# Patient Record
Sex: Male | Born: 1990 | Race: Black or African American | Hispanic: No | Marital: Single | Smoking: Current every day smoker
Health system: Southern US, Community
[De-identification: ages and names within clinical notes are randomized; demographics above are authoritative.]

## PROBLEM LIST (undated history)

## (undated) DIAGNOSIS — Z22322 Carrier or suspected carrier of Methicillin resistant Staphylococcus aureus: Secondary | ICD-10-CM

## (undated) DIAGNOSIS — G43909 Migraine, unspecified, not intractable, without status migrainosus: Secondary | ICD-10-CM

## (undated) DIAGNOSIS — F1410 Cocaine abuse, uncomplicated: Secondary | ICD-10-CM

## (undated) DIAGNOSIS — Z5900 Homelessness unspecified: Secondary | ICD-10-CM

## (undated) DIAGNOSIS — Z8614 Personal history of Methicillin resistant Staphylococcus aureus infection: Secondary | ICD-10-CM

## (undated) DIAGNOSIS — J3489 Other specified disorders of nose and nasal sinuses: Secondary | ICD-10-CM

## (undated) DIAGNOSIS — S60351A Superficial foreign body of right thumb, initial encounter: Secondary | ICD-10-CM

## (undated) HISTORY — PX: SHOULDER ARTHROSCOPY W/ LABRAL REPAIR: SHX2399

## (undated) HISTORY — PX: ORIF ANKLE FRACTURE: SHX5408

---
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
## 2010-05-30 ENCOUNTER — Emergency Department (HOSPITAL_COMMUNITY): Admission: EM | Admit: 2010-05-30 | Discharge: 2010-05-30 | Payer: Self-pay | Admitting: Emergency Medicine

## 2011-05-09 NOTE — Op Note (Signed)
[facility]  Patient:    Donald Hurst, Donald Hurst                        MRN: [id number] Proc. Date: 05/27/01 Adm. Date:  [id number] Attending:  Barbee Cough                           Operative Report  PREOPERATIVE DIAGNOSES AND INDICATIONS FOR SURGERY: 1. Recurrent acute tonsillitis. 2. Adenotonsillar hypertrophy. 3. Recurrent epistaxis.  POSTOPERATIVE DIAGNOSES: 1. Recurrent acute tonsillitis. 2. Adenotonsillar hypertrophy. 3. Recurrent epistaxis.  OPERATION: 1. Adenotonsillectomy. 2. Anterior nasal cautery.  SURGEON:  Kinnie Scales. Annalee Genta, M.D.  ANESTHESIA:  General endotracheal anesthesia.  COMPLICATIONS:  None.  ESTIMATED BLOOD LOSS:  Less than 50 cc.  DISPOSITION:  The patient transferred from the operating room to the recovery room in stable condition.  He will follow up in my office in approximately two weeks for postoperative care.  BRIEF HISTORY:  Donald Hurst is a 20 year old black male who presented in referral from his primary care physician for evaluation of recurrent epistaxis and recurrent tonsillitis.  The patient had been treated with numerous courses of antibiotics in the last year and had a history of recurrent streptococcal infection, adenotonsillar enlargement, and nighttime snoring.  The patient also had a history of intermittent epistaxis.  He had been treated with epistaxis precautions including frequent nasal saline spray and topical ointment.  Despite this, he has continued to have intermittent brief episodes of bleeding from the anterior nasal passage way.  Given the patients history, examination, and findings, the risks, benefits, and possible complications of the above surgical procedures were discussed in detail with the patient and his mother.  They understood and concurred with our plan for surgery which was scheduled as above.  DESCRIPTION OF PROCEDURE:  The patient was brought to the operating room at [facility] on May 27, 2001, and placed in the supine position on the operating table.  General endotracheal anesthesia was established without difficulty.  When the patient had been adequately anesthetized, his nasal cavity was examined, and he was found to have significant anterior submucosal blood vessels without evidence of active bleeding, mass, tumor, or ulceration.  The patients nose was paced with Afrin-soaked cottonoid pledgets which were left in place for approximately 5 minutes to all for vasoconstriction.  Using a Bovie suction cautery, areas of point hemorrhage and hypervascularity were cauterized bilaterally.  Care was taken not to cauterize corresponding spots on each side of the nasal septum.  At the conclusion of the cautery procedure, there was no active bleeding, and bacitracin ointment was placed over the cautery sites.  Attention was then turned to the patients oral cavity and oropharynx.  A Crowe-Davis mouth gag was inserted without difficulty with no evidence of broken teeth, and hard and soft palate were intact.  The patient was found to have 2+ cryptic tonsils and adenoidal hypertrophy.  The adenoids were removed using the adenotome and several passes with the adenoid curet.  Posterior and nasopharynx were then packed with saline-soaked cottonoid gauze.  The patients tonsils were removed beginning on the left-hand side, dissecting in subcapsular fashion, removing the entire left tonsil from superior pole to tongue base.  The right tonsil was removed in a similar fashion, and all tissue that was resected was sent to pathology for gross and microscopic evaluation.  tonsillar fossae were gently abraded with  a dry tonsil sponge, and the Crowe-Davis mouth gag was released and reapplied.  There were several areas of point hemorrhage which were cauterized using suction cautery. There was no active bleeding.  Posterior nasopharyngeal packing was removed, and  the posterior nasopharynx was cauterized with suction cautery.  An orogastric tube was passed, and stomach contents were aspirated.  The patients oral cavity, oropharynx, nasal cavity, and nasopharynx were then irrigated with saline solution and suctioned.  The patient was then awakened from the anesthetic, extubated, and transferred from the operating room to the recovery room in stable condition.  There were no complications, and blood loss was less than 50 cc. DD:  05/27/01 TD:  05/27/01 Job: [id number]

## 2011-06-28 ENCOUNTER — Emergency Department (HOSPITAL_COMMUNITY)
Admission: EM | Admit: 2011-06-28 | Discharge: 2011-06-28 | Disposition: A | Discharge status: Home / Self Care | Payer: Self-pay | Attending: Emergency Medicine | Admitting: Emergency Medicine

## 2011-06-28 ENCOUNTER — Emergency Department (HOSPITAL_COMMUNITY): Payer: Self-pay

## 2011-06-28 DIAGNOSIS — IMO0002 Reserved for concepts with insufficient information to code with codable children: Secondary | ICD-10-CM | POA: Insufficient documentation

## 2013-12-29 ENCOUNTER — Encounter: Payer: Self-pay | Admitting: Family Medicine

## 2013-12-29 ENCOUNTER — Ambulatory Visit: Payer: Self-pay | Attending: Internal Medicine | Admitting: Family Medicine

## 2013-12-29 VITALS — BP 135/88 | HR 86 | Temp 98.4°F | Resp 17 | Ht 75.0 in | Wt 254.0 lb

## 2013-12-29 DIAGNOSIS — L859 Epidermal thickening, unspecified: Secondary | ICD-10-CM

## 2013-12-29 DIAGNOSIS — R6889 Other general symptoms and signs: Secondary | ICD-10-CM

## 2013-12-29 DIAGNOSIS — L989 Disorder of the skin and subcutaneous tissue, unspecified: Secondary | ICD-10-CM

## 2013-12-29 DIAGNOSIS — L851 Acquired keratosis [keratoderma] palmaris et plantaris: Secondary | ICD-10-CM

## 2013-12-29 DIAGNOSIS — R7989 Other specified abnormal findings of blood chemistry: Secondary | ICD-10-CM | POA: Insufficient documentation

## 2013-12-29 DIAGNOSIS — R899 Unspecified abnormal finding in specimens from other organs, systems and tissues: Secondary | ICD-10-CM

## 2013-12-29 NOTE — Progress Notes (Signed)
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

## 2013-12-29 NOTE — Progress Notes (Signed)
   Subjective:    Patient ID: Donald Hurst, male    DOB: 1991-04-03, 23 y.o.   MRN: [id number]  HPI This patient is seen today to establish care. 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 He goes to school at [employer] [place]. 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 He isn't positive that that's what the person says that she told him to be seen urgently. He denies any systemic symptoms including any symptoms of sexually transmitted diseases. He does not have any of the lab work with him.  He also is concerned about rough skin over both but opts which is itchy especially right after he gets out of the shower. He also has a single perianal bump and a similar bump on his left anterior scrotum. These have all been present since last summer and have not changed.   Review of Systems A 12 point review of systems is negative except as per hpi.      Objective:   Physical Exam  Nursing note and vitals reviewed. Constitutional: He is oriented to person, place, and time. He  appears well-developed and well-nourished.  HENT:  Right Ear: External ear normal.  L Cardiovascular: Normal rate, regular rhythm and normal heart sounds.   Pulmonary/Chest: Effort normal and breath sounds normal.  Abdominal: Soft. Bowel sounds are normal.  no distension. There is no tenderness. There is no rebound.  Lymphadenopathy:    He has no cervical adenopathy.  Neurological: He is alert and oriented to person, place, and time. He has normal reflexes.  Skin: Skin is warm and dry.the ducts are dry  And consistent with hyperkeratosis pilaris. The bumps mentioned in the history of present illness are both about a third of a centimeter or in diameter and raised. More consistent with skin tags then warts but not completely typical skin tags  Psychiatric: He has a normal mood and  affect. His behavior is normal.       Assessment & Plan:  Abnormal laboratory test result Will obtain the results from the health Department and proceed accordingly  Hyperkeratosis Per avs  Skin lesion - If the biopsy is probably a good idea for these lesions. The patient will make an appointment to come back and have them biopsied. See AVS for specific instructions.

## 2013-12-29 NOTE — Patient Instructions (Signed)
-   Skin irritation - moisturize well at least twice daily. Try an exfoliant.  - Call and schedule a Thursday for your skin biopsy. Make sure they put you on my schedule and remind them to let me know so I can have the needed supplies.  - We will obtain the records from the Health Department regarding your Hepatitis B. Once I see the results, I will call and let you know what the next steps are. For now, either avoid sexual contact or be sure to wear a condom.  -Call with any questions or concerns.

## 2014-11-23 ENCOUNTER — Emergency Department (HOSPITAL_COMMUNITY)
Admission: EM | Admit: 2014-11-23 | Discharge: 2014-11-23 | Disposition: A | Discharge status: Home / Self Care | Payer: No Typology Code available for payment source | Attending: Emergency Medicine | Admitting: Emergency Medicine

## 2014-11-23 ENCOUNTER — Encounter (HOSPITAL_COMMUNITY): Payer: Self-pay | Admitting: Emergency Medicine

## 2014-11-23 ENCOUNTER — Emergency Department (HOSPITAL_COMMUNITY): Payer: No Typology Code available for payment source

## 2014-11-23 DIAGNOSIS — S43401A Unspecified sprain of right shoulder joint, initial encounter: Secondary | ICD-10-CM | POA: Insufficient documentation

## 2014-11-23 DIAGNOSIS — Z720 Tobacco use: Secondary | ICD-10-CM | POA: Diagnosis not present

## 2014-11-23 DIAGNOSIS — S0990XA Unspecified injury of head, initial encounter: Secondary | ICD-10-CM | POA: Diagnosis not present

## 2014-11-23 DIAGNOSIS — Y998 Other external cause status: Secondary | ICD-10-CM | POA: Diagnosis not present

## 2014-11-23 DIAGNOSIS — Y9389 Activity, other specified: Secondary | ICD-10-CM | POA: Diagnosis not present

## 2014-11-23 DIAGNOSIS — S39012A Strain of muscle, fascia and tendon of lower back, initial encounter: Secondary | ICD-10-CM | POA: Diagnosis not present

## 2014-11-23 DIAGNOSIS — S8011XA Contusion of right lower leg, initial encounter: Secondary | ICD-10-CM | POA: Diagnosis not present

## 2014-11-23 DIAGNOSIS — Y92410 Unspecified street and highway as the place of occurrence of the external cause: Secondary | ICD-10-CM | POA: Insufficient documentation

## 2014-11-23 DIAGNOSIS — S3992XA Unspecified injury of lower back, initial encounter: Secondary | ICD-10-CM | POA: Diagnosis present

## 2014-11-23 DIAGNOSIS — Z9889 Other specified postprocedural states: Secondary | ICD-10-CM | POA: Insufficient documentation

## 2014-11-23 DIAGNOSIS — S20219A Contusion of unspecified front wall of thorax, initial encounter: Secondary | ICD-10-CM

## 2014-11-23 MED ORDER — IBUPROFEN 600 MG PO TABS: 600.0000 mg | ORAL_TABLET | Freq: Four times a day (QID) | ORAL | Status: DC | PRN

## 2014-11-23 MED ORDER — HYDROCODONE-ACETAMINOPHEN 5-325 MG PO TABS: 1.0000 | ORAL_TABLET | Freq: Four times a day (QID) | ORAL | Status: DC | PRN

## 2014-11-23 MED ORDER — TRAMADOL HCL 50 MG PO TABS: 50.0000 mg | ORAL_TABLET | Freq: Four times a day (QID) | ORAL | Status: DC | PRN

## 2014-11-23 MED ORDER — CYCLOBENZAPRINE HCL 10 MG PO TABS: 10.0000 mg | ORAL_TABLET | Freq: Two times a day (BID) | ORAL | Status: DC | PRN
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
## 2015-11-14 ENCOUNTER — Emergency Department (HOSPITAL_COMMUNITY)
Admission: EM | Admit: 2015-11-14 | Discharge: 2015-11-14 | Disposition: A | Discharge status: Home / Self Care | Payer: Self-pay | Attending: Emergency Medicine | Admitting: Emergency Medicine

## 2015-11-14 ENCOUNTER — Encounter (HOSPITAL_COMMUNITY): Payer: Self-pay | Admitting: Emergency Medicine

## 2015-11-14 DIAGNOSIS — Z8781 Personal history of (healed) traumatic fracture: Secondary | ICD-10-CM | POA: Insufficient documentation

## 2015-11-14 DIAGNOSIS — G8911 Acute pain due to trauma: Secondary | ICD-10-CM | POA: Insufficient documentation

## 2015-11-14 DIAGNOSIS — M25571 Pain in right ankle and joints of right foot: Secondary | ICD-10-CM | POA: Insufficient documentation

## 2015-11-14 DIAGNOSIS — Z7982 Long term (current) use of aspirin: Secondary | ICD-10-CM | POA: Insufficient documentation

## 2015-11-14 DIAGNOSIS — Z87891 Personal history of nicotine dependence: Secondary | ICD-10-CM | POA: Insufficient documentation

## 2015-11-14 MED ORDER — HYDROMORPHONE HCL 2 MG PO TABS: 1.0000 mg | ORAL_TABLET | ORAL | Status: DC | PRN

## 2015-11-14 MED ORDER — POLYETHYLENE GLYCOL 3350 17 G PO PACK: 17.0000 g | PACK | Freq: Every day | ORAL | Status: DC | PRN

## 2015-11-14 MED ORDER — KETOROLAC TROMETHAMINE 30 MG/ML IJ SOLN: 60.0000 mg | Freq: Once | INTRAMUSCULAR | Status: DC

## 2015-11-14 MED ORDER — MORPHINE SULFATE (PF) 4 MG/ML IV SOLN: 4.0000 mg | Freq: Once | INTRAVENOUS | Status: DC

## 2015-11-14 MED ORDER — HYDROMORPHONE HCL 1 MG/ML IJ SOLN
1.0000 mg | Freq: Once | INTRAMUSCULAR | Status: AC
  Administered 2015-11-14: 1 mg via INTRAVENOUS
  Filled 2015-11-14: qty 1

## 2015-11-14 MED ORDER — KETOROLAC TROMETHAMINE 30 MG/ML IJ SOLN
60.0000 mg | Freq: Once | INTRAMUSCULAR | Status: DC
  Filled 2015-11-14: qty 2

## 2015-11-14 MED ORDER — KETOROLAC TROMETHAMINE 30 MG/ML IJ SOLN
30.0000 mg | Freq: Once | INTRAMUSCULAR | Status: AC
  Administered 2015-11-14: 30 mg via INTRAVENOUS
  Filled 2015-11-14: qty 1

## 2015-11-14 MED ORDER — MORPHINE SULFATE (PF) 4 MG/ML IV SOLN
4.0000 mg | Freq: Once | INTRAVENOUS | Status: DC
Start: 2015-11-14 — End: 2015-11-14
  Filled 2015-11-14: qty 1

## 2015-11-14 NOTE — ED Provider Notes (Signed)
CSN: [id number]     Arrival date & time 11/14/15  2020 History   First MD Initiated Contact with Patient 11/14/15 2032     Chief Complaint  Patient presents with  . Post-op Problem     (Consider location/radiation/quality/duration/timing/severity/associated sxs/prior Treatment) HPI Comments: Consent for right ankle pain after surgery or fracture. He reports right ankle fracture Saturday due to football while in [place]. He had surgery there and was discharged to follow-up with his orthopedist here in [place]. He has been taking morphine ER 15 mg twice a day as well as some Norco 7.5 mg every 4 hours, but continues to endorse moderate pain. Denies any fevers, chills, drainage from his incision. Has appointment to follow-up with his local orthopedist on Tuesday.    History reviewed. No pertinent past medical history. Past Surgical History  Procedure Laterality Date  . Shoulder surgery Right   . Ankle surgery     No family history on file. Social History  Substance Use Topics  . Smoking status: Former Games developer  . Smokeless tobacco: None     Comment: marijuana  . Alcohol Use: Yes     Comment: occasionally    Review of Systems  All other systems reviewed and are negative.     Allergies  Shrimp and Tramadol  Home Medications   Prior to Admission medications   Medication Sig Start Date End Date Taking? Authorizing Provider  aspirin 325 MG EC tablet Take 325 mg by mouth 2 (two) times daily. Started medication on 11-13-15. Take for six weeks   Yes Historical Provider, MD  HYDROcodone-acetaminophen (NORCO) 7.5-325 MG tablet Take 2 tablets by mouth every 4 (four) hours as needed for moderate pain.   Yes Historical Provider, MD  oxycodone (OXY-IR) 5 MG capsule Take 10 mg by mouth every 6 (six) hours as needed for pain.   Yes Historical Provider, MD  PRESCRIPTION MEDICATION Take 1 tablet by mouth every 12 (twelve) hours.   Yes Historical Provider, MD  cyclobenzaprine (FLEXERIL) 10  MG tablet Take 1 tablet (10 mg total) by mouth 2 (two) times daily as needed for muscle spasms. Patient not taking: Reported on 11/14/2015 11/23/14   Jaynie Crumble, PA-C  HYDROcodone-acetaminophen (NORCO) 5-325 MG per tablet Take 1 tablet by mouth every 6 (six) hours as needed for moderate pain. Patient not taking: Reported on 11/14/2015 11/23/14   Tatyana Kirichenko, PA-C  HYDROmorphone (DILAUDID) 2 MG tablet Take 0.5-1 tablets (1-2 mg total) by mouth every 4 (four) hours as needed for severe pain. 11/14/15   Jamal Collin, MD  ibuprofen (ADVIL,MOTRIN) 600 MG tablet Take 1 tablet (600 mg total) by mouth every 6 (six) hours as needed. Patient not taking: Reported on 11/14/2015 11/23/14   Tatyana Kirichenko, PA-C  polyethylene glycol (MIRALAX / GLYCOLAX) packet Take 17 g by mouth daily as needed. 11/14/15   Jamal Collin, MD  traMADol (ULTRAM) 50 MG tablet Take 1 tablet (50 mg total) by mouth every 6 (six) hours as needed. Patient not taking: Reported on 11/14/2015 11/23/14   Tatyana Kirichenko, PA-C   BP 140/73 mmHg  Pulse 79  Temp(Src) 98.4 F (36.9 C) (Oral)  Resp 16  SpO2 99% Physical Exam  Constitutional: He is oriented to person, place, and time. He appears well-developed and well-nourished. No distress.  Eyes: EOM are normal. Pupils are equal, round, and reactive to light.  Cardiovascular: Normal rate and regular rhythm.   No murmur heard. Pulmonary/Chest: Effort normal and breath sounds normal. No respiratory  distress.  Abdominal: Soft. Bowel sounds are normal. There is no tenderness.  Musculoskeletal:  Right ankle mildly swollen. Staples clean without any drainage. Dorsalis pedis 2+.   Neurological: He is alert and oriented to person, place, and time.  Skin: Skin is warm. No rash noted. No erythema.  Nursing note and vitals reviewed.   ED Course  Procedures (including critical care time) Labs Review Labs Reviewed - No data to display  Imaging Review No results  found. I have personally reviewed and evaluated these images and lab results as part of my medical decision-making.   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 Pain control was achieved with IV Dilaudid and Toradol. He was discharged with by mouth Dilaudid for pain relief and advised to keep his scheduled orthopedic follow-up. No signs of infection or compartment syndrome.   Jamal CollinJames R Lili Harts, MD 11/14/2015, 10:08 PM PGY-3, [facility]     Jamal CollinJames R Davette Nugent, MD 11/14/15 [id number]  Nelva Nayobert Beaton, MD 11/14/15 2210

## 2015-11-14 NOTE — ED Notes (Signed)
Pt had surgery Sunday for broken ankle. Pt had cast removed and boot placed yesterday. Pt reports incision has been bleeding since and that he needs stronger pain medications

## 2015-11-14 NOTE — Discharge Instructions (Signed)
Your incision looks clean without infection. I recommend he keep your scheduled follow-up with orthopedist on Tuesday. Return to ED if you develop any fevers, chills.

## 2015-11-14 NOTE — ED Notes (Signed)
MD at bedside. 

## 2015-12-26 ENCOUNTER — Emergency Department (HOSPITAL_COMMUNITY)
Admission: EM | Admit: 2015-12-26 | Discharge: 2015-12-26 | Disposition: A | Discharge status: Home / Self Care | Payer: Self-pay | Attending: Emergency Medicine | Admitting: Emergency Medicine

## 2015-12-26 ENCOUNTER — Encounter (HOSPITAL_COMMUNITY): Payer: Self-pay | Admitting: *Deleted

## 2015-12-26 DIAGNOSIS — Z96698 Presence of other orthopedic joint implants: Secondary | ICD-10-CM | POA: Insufficient documentation

## 2015-12-26 DIAGNOSIS — Z7982 Long term (current) use of aspirin: Secondary | ICD-10-CM | POA: Insufficient documentation

## 2015-12-26 DIAGNOSIS — Z87891 Personal history of nicotine dependence: Secondary | ICD-10-CM | POA: Insufficient documentation

## 2015-12-26 DIAGNOSIS — Z760 Encounter for issue of repeat prescription: Secondary | ICD-10-CM | POA: Insufficient documentation

## 2015-12-26 DIAGNOSIS — M7989 Other specified soft tissue disorders: Secondary | ICD-10-CM | POA: Insufficient documentation

## 2015-12-26 MED ORDER — NAPROXEN 500 MG PO TABS: 500.0000 mg | ORAL_TABLET | Freq: Two times a day (BID) | ORAL | Status: DC

## 2015-12-26 MED ORDER — OXYCODONE HCL 5 MG PO TABS: 2.5000 mg | ORAL_TABLET | ORAL | Status: DC | PRN

## 2015-12-26 NOTE — ED Provider Notes (Signed)
CSN: [id number]     Arrival date & time 12/26/15  1926 History  By signing my name below, I, Donald Hurst, attest that this documentation has been prepared under the direction and in the presence of Arthor CaptainAbigail Zia Kanner, PA-C. Electronically Signed: Placido SouLogan Hurst, ED Scribe. 12/26/2015. 8:28 PM.   Chief Complaint  Patient presents with  . Medication Refill   The history is provided by the patient. No language interpreter was used.    HPI Comments: Donald Hurst is a 25 y.o. male who presents to the Emergency Department for a refill of his rx pain medication that he received s/p right ankle surgery. Pt notes a recent [place] to [place] with a cast currently in place. He notes some current itching of the affected toe and mild swelling of his toes which he says is alleviated when he elevates the foot. He notes he was taking percocet which provided adequate relief of his pain. He notes having a [facility] up [facility] on 1/12. He denies any other associated symptoms at this time.   History reviewed. No pertinent past medical history. Past Surgical History  Procedure Laterality Date  . Shoulder surgery Right   . Ankle surgery     History reviewed. No pertinent family history. Social History  Substance Use Topics  . Smoking status: Former Games developermoker  . Smokeless tobacco: None     Comment: marijuana  . Alcohol Use: Yes     Comment: occasionally    Review of Systems  Constitutional: Negative for fever and chills.  Musculoskeletal: Positive for joint swelling. Negative for myalgias and arthralgias.    Allergies  Shrimp and Tramadol  Home Medications   Prior to Admission medications   Medication Sig Start Date End Date Taking? Authorizing Provider  aspirin 325 MG EC tablet Take 325 mg by mouth 2 (two) times daily. Started medication on 11-13-15. Take for six weeks    Historical Provider, MD  cyclobenzaprine (FLEXERIL) 10 MG tablet Take 1 tablet (10 mg  total) by mouth 2 (two) times daily as needed for muscle spasms. Patient not taking: Reported on 11/14/2015 11/23/14   Jaynie Crumbleatyana Kirichenko, PA-C  HYDROcodone-acetaminophen (NORCO) 5-325 MG per tablet Take 1 tablet by mouth every 6 (six) hours as needed for moderate pain. Patient not taking: Reported on 11/14/2015 11/23/14   Jaynie Crumbleatyana Kirichenko, PA-C  HYDROcodone-acetaminophen (NORCO) 7.5-325 MG tablet Take 2 tablets by mouth every 4 (four) hours as needed for moderate pain.    Historical Provider, MD  HYDROmorphone (DILAUDID) 2 MG tablet Take 0.5-1 tablets (1-2 mg total) by mouth every 4 (four) hours as needed for severe pain. 11/14/15   Jamal CollinJames R Joyner, MD  ibuprofen (ADVIL,MOTRIN) 600 MG tablet Take 1 tablet (600 mg total) by mouth every 6 (six) hours as needed. Patient not taking: Reported on 11/14/2015 11/23/14   Tatyana Kirichenko, PA-C  naproxen (NAPROSYN) 500 MG tablet Take 1 tablet (500 mg total) by mouth 2 (two) times daily with a meal. 12/26/15   Arthor CaptainAbigail Aison Malveaux, PA-C  oxyCODONE (OXY IR/ROXICODONE) 5 MG immediate release tablet Take 0.5-1 tablets (2.5-5 mg total) by mouth every 4 (four) hours as needed for severe pain. 12/26/15   Arthor CaptainAbigail Ellar Hakala, PA-C  polyethylene glycol (MIRALAX / GLYCOLAX) packet Take 17 g by mouth daily as needed. 11/14/15   Jamal CollinJames R Joyner, MD  PRESCRIPTION MEDICATION Take 1 tablet by mouth every 12 (twelve) hours.    Historical Provider, MD  traMADol (ULTRAM) 50 MG  tablet Take 1 tablet (50 mg total) by mouth every 6 (six) hours as needed. Patient not taking: Reported on 11/14/2015 11/23/14   Tatyana Kirichenko, PA-C   BP 143/83 mmHg  Pulse 70  Temp(Src) 98.2 F (36.8 C) (Oral)  Resp 16  Ht 6\' 3"  (1.905 m)  Wt 113.399 kg  BMI 31.25 kg/m2  SpO2 98%    Physical Exam  Constitutional: He is oriented to person, place, and time. He appears well-developed and well-nourished.  HENT:  Head: Normocephalic and atraumatic.  Neck: Normal range of motion.  Cardiovascular: Normal  rate.   Pulmonary/Chest: Effort normal. No respiratory distress.  Abdominal: Soft.  Musculoskeletal: Normal range of motion.  Pt has a cast in place on his right foot  Neurological: He is alert and oriented to person, place, and time.  Skin: Skin is warm and dry. He is not diaphoretic.  Psychiatric: He has a normal mood and affect. His behavior is normal.  Nursing note and vitals reviewed.   ED Course  Procedures  DIAGNOSTIC STUDIES: Oxygen Saturation is 98% on RA, normal by my interpretation.    COORDINATION OF CARE: 8:22 PM Discussed next steps with pt including a short term refill of his rx pain medication and he was agreeable to the plan.   Labs Review Labs Reviewed - No data to display  Imaging Review No results found.   EKG Interpretation None      MDM   Final diagnoses:  Medication refill    Pt here for refill of medication. Medication is not a controlled substance. Will refill medication here. Discussed need to follow up with his surgeon for continued pain control. Pt is safe for discharge at this time.  I personally performed the services described in this documentation, which was scribed in my presence. The recorded information has been reviewed and is accurate.      Arthor Captain, PA-C 12/29/15 1858  Loren Racer, MD 12/29/15 [phone]

## 2015-12-26 NOTE — Discharge Instructions (Signed)
Medicine Refill at the Emergency Department  We have refilled your medicine today, but it is best for you to get refills through your primary health care provider's office. In the future, please plan ahead so you do not need to get refills from the emergency department.  If the medicine we refilled was a maintenance medicine, you may have received only enough to get you by until you are able to see your regular health care provider.     This information is not intended to replace advice given to you by your health care provider. Make sure you discuss any questions you have with your health care provider.     Document Released: 03/26/2004 Document Revised: 12/29/2014 Document Reviewed: 03/17/2014  Elsevier Interactive Patient Education 2016 Elsevier Inc.

## 2015-12-26 NOTE — ED Notes (Signed)
Pt fractured R foot, has been taken hydrocodone and is out of medication. Has follow up appt 1/12 with orthopedic

## 2016-02-12 ENCOUNTER — Encounter (HOSPITAL_COMMUNITY): Payer: Self-pay | Admitting: Emergency Medicine

## 2016-02-12 ENCOUNTER — Emergency Department (HOSPITAL_COMMUNITY): Payer: Self-pay

## 2016-02-12 ENCOUNTER — Emergency Department (HOSPITAL_COMMUNITY)
Admission: EM | Admit: 2016-02-12 | Discharge: 2016-02-12 | Disposition: A | Discharge status: Home / Self Care | Payer: Self-pay | Attending: Emergency Medicine | Admitting: Emergency Medicine

## 2016-02-12 DIAGNOSIS — Y9289 Other specified places as the place of occurrence of the external cause: Secondary | ICD-10-CM | POA: Insufficient documentation

## 2016-02-12 DIAGNOSIS — Y998 Other external cause status: Secondary | ICD-10-CM | POA: Insufficient documentation

## 2016-02-12 DIAGNOSIS — Z791 Long term (current) use of non-steroidal anti-inflammatories (NSAID): Secondary | ICD-10-CM | POA: Insufficient documentation

## 2016-02-12 DIAGNOSIS — S99911A Unspecified injury of right ankle, initial encounter: Secondary | ICD-10-CM | POA: Insufficient documentation

## 2016-02-12 DIAGNOSIS — Z87891 Personal history of nicotine dependence: Secondary | ICD-10-CM | POA: Insufficient documentation

## 2016-02-12 DIAGNOSIS — Y9389 Activity, other specified: Secondary | ICD-10-CM | POA: Insufficient documentation

## 2016-02-12 DIAGNOSIS — M25571 Pain in right ankle and joints of right foot: Secondary | ICD-10-CM

## 2016-02-12 DIAGNOSIS — W1839XA Other fall on same level, initial encounter: Secondary | ICD-10-CM | POA: Insufficient documentation

## 2016-02-12 DIAGNOSIS — Z7982 Long term (current) use of aspirin: Secondary | ICD-10-CM | POA: Insufficient documentation

## 2016-02-12 MED ORDER — OXYCODONE-ACETAMINOPHEN 5-325 MG PO TABS
2.0000 | ORAL_TABLET | Freq: Once | ORAL | Status: DC
  Filled 2016-02-12: qty 2

## 2016-02-12 MED ORDER — IBUPROFEN 600 MG PO TABS: 600.0000 mg | ORAL_TABLET | Freq: Four times a day (QID) | ORAL | Status: DC | PRN

## 2016-02-12 MED ORDER — HYDROCODONE-ACETAMINOPHEN 5-325 MG PO TABS
2.0000 | ORAL_TABLET | Freq: Once | ORAL | Status: AC
  Administered 2016-02-12: 2 via ORAL
  Filled 2016-02-12: qty 2

## 2016-02-12 NOTE — Discharge Instructions (Signed)
Instructions per Dr. Candis Shine:  Fracture boot and begin range of motion and strengthening exercises to his right ankle three to four times a week. Follow up in five weeks.  Pain meds and aspirin a day as well, 325 mg. There was notation of Physical Therapy Follow up appointment was missed. Please go back to Orthopedic surgeon for management.    RICE for Routine Care of Injuries Theroutine careofmanyinjuriesincludes rest, ice, compression, and elevation (RICE therapy). RICE therapy is often recommended for injuries to soft tissues, such as a muscle strain, ligament injuries, bruises, and overuse injuries. It can also be used for some bony injuries. Using RICE therapy can help to relieve pain, lessen swelling, and enable your body to heal. Rest Rest is required to allow your body to heal. This usually involves reducing your normal activities and avoiding use of the injured part of your body. Generally, you can return to your normal activities when you are comfortable and have been given permission by your health care provider. Ice Icing your injury helps to keep the swelling down, and it lessens pain. Do not apply ice directly to your skin.  Put ice in a plastic bag.  Place a towel between your skin and the bag.  Leave the ice on for 20 minutes, 2-3 times a day. Do this for as long as you are directed by your health care provider. Compression Compression means putting pressure on the injured area. Compression helps to keep swelling down, gives support, and helps with discomfort. Compression may be done with an elastic bandage. If an elastic bandage has been applied, follow these general tips:  Remove and reapply the bandage every 3-4 hours or as directed by your health care provider.  Make sure the bandage is not wrapped too tightly, because this can cut off circulation. If part of your body beyond the bandage becomes blue, numb, cold, swollen, or more painful, your bandage is most  likely too tight. If this occurs, remove your bandage and reapply it more loosely.  See your health care provider if the bandage seems to be making your problems worse rather than better. Elevation Elevation means keeping the injured area raised. This helps to lessen swelling and decrease pain. If possible, your injured area should be elevated at or above the level of your heart or the center of your chest. WHEN SHOULD I SEEK MEDICAL CARE? You should seek medical care if:  Your pain and swelling continue.  Your symptoms are getting worse rather than improving. These symptoms may indicate that further evaluation or further X-rays are needed. Sometimes, X-rays may not show a small broken bone (fracture) until a number of days later. Make a follow-up appointment with your health care provider. WHEN SHOULD I SEEK IMMEDIATE MEDICAL CARE? You should seek immediate medical care if:  You have sudden severe pain at or below the area of your injury.  You have redness or increased swelling around your injury.  You have tingling or numbness at or below the area of your injury that does not improve after you remove the elastic bandage.   This information is not intended to replace advice given to you by your health care provider. Make sure you discuss any questions you have with your health care provider.   Document Released: 03/22/2001 Document Revised: 08/29/2015 Document Reviewed: 11/15/2014 Elsevier Interactive Patient Education [employer]

## 2016-02-12 NOTE — ED Provider Notes (Signed)
CSN: [id number]     Arrival date & time 02/12/16  1323 History   First MD Initiated Contact with Patient 02/12/16 1428     Chief Complaint  Patient presents with  . Ankle Pain     (Consider location/radiation/quality/duration/timing/severity/associated sxs/prior Treatment) HPI   Donald Hurst is a 25 year old male, with recent (Nov 2016) right ankle surgery, reports the emergency room with complaint of right ankle pain and swelling status post fall 3 days ago.  Patient states he sustained his injury while playing football in [place] in November of last year and had emergent surgery on his ankle. He states that when he returned to [place] he has seen Dr. Candis Shine of [facility] who removed his cast the middle of January. He was put in a walking boot and was given a knee scooter. He has not been back to see the orthopedic surgeon since then. He states that he ran out of medications last week. He also states he has been having "a rough time" getting to class and is currently staying with his friends.  Apparently 3 days ago he was getting up off the couch and he was not wearing his boot when he rolled his ankle with inversion. He states that his been hot, swollen and red since then without relief of over-the-counter NSAIDs.  He claims that the orthopedic surgeon is not his doctor and not managing him anymore.  He is unsure about his post op and rehab instructions. No other complaints  History reviewed. No pertinent past medical history. Past Surgical History  Procedure Laterality Date  . Shoulder surgery Right   . Ankle surgery     History reviewed. No pertinent family history. Social History  Substance Use Topics  . Smoking status: Former Games developer  . Smokeless tobacco: None     Comment: marijuana  . Alcohol Use: Yes     Comment: occasionally    Review of Systems  Constitutional: Negative for fever, chills, diaphoresis and fatigue.  Musculoskeletal: Positive for joint  swelling and arthralgias.  Skin: Positive for color change. Negative for rash.  Neurological: Negative.  Negative for numbness.  All other systems reviewed and are negative.     Allergies  Shrimp and Tramadol  Home Medications   Prior to Admission medications   Medication Sig Start Date End Date Taking? Authorizing Provider  aspirin 325 MG EC tablet Take 325 mg by mouth 2 (two) times daily. Started medication on 11-13-15. Take for six weeks    Historical Provider, MD  cyclobenzaprine (FLEXERIL) 10 MG tablet Take 1 tablet (10 mg total) by mouth 2 (two) times daily as needed for muscle spasms. Patient not taking: Reported on 11/14/2015 11/23/14   Jaynie Crumble, PA-C  HYDROcodone-acetaminophen (NORCO) 5-325 MG per tablet Take 1 tablet by mouth every 6 (six) hours as needed for moderate pain. Patient not taking: Reported on 11/14/2015 11/23/14   Jaynie Crumble, PA-C  HYDROcodone-acetaminophen (NORCO) 7.5-325 MG tablet Take 2 tablets by mouth every 4 (four) hours as needed for moderate pain.    Historical Provider, MD  HYDROmorphone (DILAUDID) 2 MG tablet Take 0.5-1 tablets (1-2 mg total) by mouth every 4 (four) hours as needed for severe pain. 11/14/15   Jamal Collin, MD  ibuprofen (ADVIL,MOTRIN) 600 MG tablet Take 1 tablet (600 mg total) by mouth every 6 (six) hours as needed. 02/12/16   Danelle Berry, PA-C  naproxen (NAPROSYN) 500 MG tablet Take 1 tablet (500 mg total) by mouth 2 (  two) times daily with a meal. 12/26/15   Arthor Captain, PA-C  oxyCODONE (OXY IR/ROXICODONE) 5 MG immediate release tablet Take 0.5-1 tablets (2.5-5 mg total) by mouth every 4 (four) hours as needed for severe pain. 12/26/15   Arthor Captain, PA-C  polyethylene glycol (MIRALAX / GLYCOLAX) packet Take 17 g by mouth daily as needed. 11/14/15   Jamal Collin, MD  PRESCRIPTION MEDICATION Take 1 tablet by mouth every 12 (twelve) hours.    Historical Provider, MD  traMADol (ULTRAM) 50 MG tablet Take 1 tablet (50 mg  total) by mouth every 6 (six) hours as needed. Patient not taking: Reported on 11/14/2015 11/23/14   Tatyana Kirichenko, PA-C   BP 131/70 mmHg  Pulse 72  Temp(Src) 98.1 F (36.7 C) (Oral)  Resp 16  SpO2 100% Physical Exam  Constitutional: He is oriented to person, place, and time. He appears well-developed and well-nourished. No distress.  HENT:  Head: Normocephalic and atraumatic.  Nose: Nose normal.  Mouth/Throat: No oropharyngeal exudate.  Eyes: Conjunctivae and EOM are normal. Pupils are equal, round, and reactive to light. Right eye exhibits no discharge. Left eye exhibits no discharge. No scleral icterus.  Neck: Normal range of motion. Neck supple. No JVD present. No tracheal deviation present.  Cardiovascular: Normal rate and regular rhythm.   Pulmonary/Chest: Effort normal and breath sounds normal. No stridor. No respiratory distress.  Musculoskeletal: He exhibits edema and tenderness.  Diffuse edema to the right ankle both medial and lateral malleolus, generalized tenderness to palpation, no erythema, no warmth, no ecchymosis, surgical incisions clean, dry, intact  Lymphadenopathy:    He has no cervical adenopathy.  Neurological: He is alert and oriented to person, place, and time. He exhibits normal muscle tone. Coordination normal.  Skin: Skin is warm and dry. No rash noted. He is not diaphoretic. No erythema. No pallor.  Psychiatric: He has a normal mood and affect. His behavior is normal. Judgment and thought content normal.  Nursing note and vitals reviewed.   ED Course  Procedures (including critical care time) Labs Review Labs Reviewed - No data to display  Imaging Review Dg Ankle Complete Right  02/12/2016  CLINICAL DATA:  Twisted right ankle at football practice a few days ago, history of ankle surgery 3 months ago with pain since surgery lateral aspect lower extremity EXAM: RIGHT ANKLE - COMPLETE 3+ VIEW COMPARISON:  11/23/2014 FINDINGS: There is a nondisplaced  fracture of the mid to distal shaft of the right fibula. This appears to be addressed with compression plate across the lateral aspect of the fibula. The mortise is intact. There are no other fractures identified. IMPRESSION: Evidence of prior fracture fibular shaft addressed by compression plate with no other acute abnormalities. Electronically Signed   By: Esperanza Heir M.D.   On: 02/12/2016 14:57   I have personally reviewed and evaluated these images and lab results as part of my medical decision-making.   EKG Interpretation None      MDM   Pt with rolled ankle 3 days ago with pain and swelling, ORIF ankle fx repair 3 months ago, he provides inconsistent and vague hx.  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  He is unsure about whether or not he is supposed to be in a walking boot or be weight bearing at all.  The history regarding "rolled ankle" was changed multiple times during his visit.  To me he tripped over something at  a friends house, to xray he claimed he injured it at football practice.  He will not specifically say whether or not he is actually bearing weight with cam walker, or using knee skooter. He did continue to ask for narcotic pain medication.  I find this suspicious for narcotic seeking.  Xray obtained to eval new injury and hardware integrity. Ortho chart reviewed using Care everywhere and pt was looked up in [place] controlled substance database.  He has received multiple narcotic rx since November.  He has missed ortho visits.  He was given PT referral and rehab instructions, inconsistent with hx provided by the pt.  Xray negative for acute injury Pt was discharged w/o narcotic pain medication.  He was instructed to contact his ortho surgeon for management of ankle.  Final diagnoses:  Right ankle pain      Danelle Berry, PA-C 02/13/16 2110  Margarita Grizzle, MD 02/15/16 1135

## 2016-02-12 NOTE — ED Notes (Signed)
Pt sts fibular fx with surgical repair at the end of November; pt sts had fall 2 days ago without his boot on and now having pain and swelling to right ankle

## 2016-02-12 NOTE — ED Notes (Signed)
Pt had ankle injury in November 2016 in [place]. Had surgery. States he is here today for refill of Percocet. Pt is wearing a CAM Walker on right leg. States he fell again.

## 2016-04-29 ENCOUNTER — Encounter (HOSPITAL_COMMUNITY): Payer: Self-pay

## 2016-04-29 ENCOUNTER — Emergency Department (HOSPITAL_COMMUNITY): Payer: BLUE CROSS/BLUE SHIELD

## 2016-04-29 ENCOUNTER — Emergency Department (HOSPITAL_COMMUNITY)
Admission: EM | Admit: 2016-04-29 | Discharge: 2016-04-30 | Disposition: A | Discharge status: Home / Self Care | Payer: BLUE CROSS/BLUE SHIELD | Attending: Emergency Medicine | Admitting: Emergency Medicine

## 2016-04-29 DIAGNOSIS — Y92410 Unspecified street and highway as the place of occurrence of the external cause: Secondary | ICD-10-CM | POA: Insufficient documentation

## 2016-04-29 DIAGNOSIS — S99911A Unspecified injury of right ankle, initial encounter: Secondary | ICD-10-CM | POA: Diagnosis not present

## 2016-04-29 DIAGNOSIS — S161XXA Strain of muscle, fascia and tendon at neck level, initial encounter: Secondary | ICD-10-CM | POA: Diagnosis not present

## 2016-04-29 DIAGNOSIS — Z8781 Personal history of (healed) traumatic fracture: Secondary | ICD-10-CM | POA: Diagnosis not present

## 2016-04-29 DIAGNOSIS — S63501A Unspecified sprain of right wrist, initial encounter: Secondary | ICD-10-CM | POA: Diagnosis not present

## 2016-04-29 DIAGNOSIS — Y9389 Activity, other specified: Secondary | ICD-10-CM | POA: Insufficient documentation

## 2016-04-29 DIAGNOSIS — S199XXA Unspecified injury of neck, initial encounter: Secondary | ICD-10-CM | POA: Diagnosis present

## 2016-04-29 DIAGNOSIS — Z791 Long term (current) use of non-steroidal anti-inflammatories (NSAID): Secondary | ICD-10-CM | POA: Diagnosis not present

## 2016-04-29 DIAGNOSIS — Z7982 Long term (current) use of aspirin: Secondary | ICD-10-CM | POA: Insufficient documentation

## 2016-04-29 DIAGNOSIS — Z9889 Other specified postprocedural states: Secondary | ICD-10-CM | POA: Insufficient documentation

## 2016-04-29 DIAGNOSIS — Y998 Other external cause status: Secondary | ICD-10-CM | POA: Diagnosis not present

## 2016-04-29 DIAGNOSIS — S3992XA Unspecified injury of lower back, initial encounter: Secondary | ICD-10-CM | POA: Insufficient documentation

## 2016-04-29 DIAGNOSIS — S0990XA Unspecified injury of head, initial encounter: Secondary | ICD-10-CM | POA: Diagnosis not present

## 2016-04-29 DIAGNOSIS — Z87891 Personal history of nicotine dependence: Secondary | ICD-10-CM | POA: Diagnosis not present

## 2016-04-29 MED ORDER — OXYCODONE-ACETAMINOPHEN 5-325 MG PO TABS
1.0000 | ORAL_TABLET | Freq: Once | ORAL | Status: AC
  Administered 2016-04-29: 1 via ORAL
  Filled 2016-04-29: qty 1
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
## 2016-04-30 MED ORDER — OXYCODONE-ACETAMINOPHEN 7.5-325 MG PO TABS: 1.0000 | ORAL_TABLET | ORAL | Status: DC | PRN

## 2016-04-30 MED ORDER — DICLOFENAC SODIUM 50 MG PO TBEC: 50.0000 mg | DELAYED_RELEASE_TABLET | Freq: Two times a day (BID) | ORAL | Status: DC
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
## 2016-04-30 NOTE — Discharge Instructions (Signed)
Do not take the narcotic if driving as it will make you sleepy. It is important that you call Dr. Kathline MagicSwinteck's office in the morning to schedule follow up. Use the crutches and keep the ankle elevated as often as possible.   Cervical Sprain A cervical sprain is when the tissues (ligaments) that hold the neck bones in place stretch or tear. HOME CARE   Put ice on the injured area.  Put ice in a plastic bag.  Place a towel between your skin and the bag.  Leave the ice on for 15-20 minutes, 3-4 times a day.  You may have been given a collar to wear. This collar keeps your neck from moving while you heal.  Do not take the collar off unless told by your doctor.  If you have long hair, keep it outside of the collar.  Ask your doctor before changing the position of your collar. You may need to change its position over time to make it more comfortable.  If you are allowed to take off the collar for cleaning or bathing, follow your doctor's instructions on how to do it safely.  Keep your collar clean by wiping it with mild soap and water. Dry it completely. If the collar has removable pads, remove them every 1-2 days to hand wash them with soap and water. Allow them to air dry. They should be dry before you wear them in the collar.  Do not drive while wearing the collar.  Only take medicine as told by your doctor.  Keep all doctor visits as told.  Keep all physical therapy visits as told.  Adjust your work station so that you have good posture while you work.  Avoid positions and activities that make your problems worse.  Warm up and stretch before being active. GET HELP IF:  Your pain is not controlled with medicine.  You cannot take less pain medicine over time as planned.  Your activity level does not improve as expected. GET HELP RIGHT AWAY IF:   You are bleeding.  Your stomach is upset.  You have an allergic reaction to your medicine.  You develop new problems that you  cannot explain.  You lose feeling (become numb) or you cannot move any part of your body (paralysis).  You have tingling or weakness in any part of your body.  Your symptoms get worse. Symptoms include:  Pain, soreness, stiffness, puffiness (swelling), or a burning feeling in your neck.  Pain when your neck is touched.  Shoulder or upper back pain.  Limited ability to move your neck.  Headache.  Dizziness.  Your hands or arms feel week, lose feeling, or tingle.  Muscle spasms.  Difficulty swallowing or chewing. MAKE SURE YOU:   Understand these instructions.  Will watch your condition.  Will get help right away if you are not doing well or get worse.   This information is not intended to replace advice given to you by your health care provider. Make sure you discuss any questions you have with your health care provider.   Document Released: 05/26/2008 Document Revised: 08/10/2013 Document Reviewed: 06/15/2013 Elsevier Interactive Patient Education 2016 [employer]  Tourist information centre managerMotor Vehicle Collision After a car crash (motor vehicle collision), it is normal to have bruises and sore muscles. The first 24 hours usually feel the worst. After that, you will likely start to feel better each day. HOME CARE  Put ice on the injured area.  Put ice in a plastic bag.  Place  a towel between your skin and the bag.  Leave the ice on for 15-20 minutes, 03-04 times a day.  Drink enough fluids to keep your pee (urine) clear or pale yellow.  Do not drink alcohol.  Take a warm shower or bath 1 or 2 times a day. This helps your sore muscles.  Return to activities as told by your doctor. Be careful when lifting. Lifting can make neck or back pain worse.  Only take medicine as told by your doctor. Do not use aspirin. GET HELP RIGHT AWAY IF:   Your arms or legs tingle, feel weak, or lose feeling (numbness).  You have headaches that do not get better with medicine.  You have neck  pain, especially in the middle of the back of your neck.  You cannot control when you pee (urinate) or poop (bowel movement).  Pain is getting worse in any part of your body.  You are short of breath, dizzy, or pass out (faint).  You have chest pain.  You feel sick to your stomach (nauseous), throw up (vomit), or sweat.  You have belly (abdominal) pain that gets worse.  There is blood in your pee, poop, or throw up.  You have pain in your shoulder (shoulder strap areas).  Your problems are getting worse. MAKE SURE YOU:   Understand these instructions.  Will watch your condition.  Will get help right away if you are not doing well or get worse.   This information is not intended to replace advice given to you by your health care provider. Make sure you discuss any questions you have with your health care provider.   Document Released: 05/26/2008 Document Revised: 03/01/2012 Document Reviewed: 05/07/2011 Elsevier Interactive Patient Education [employer]

## 2016-06-25 ENCOUNTER — Emergency Department (HOSPITAL_COMMUNITY): Payer: BLUE CROSS/BLUE SHIELD

## 2016-06-25 ENCOUNTER — Emergency Department (HOSPITAL_COMMUNITY)
Admission: EM | Admit: 2016-06-25 | Discharge: 2016-06-25 | Disposition: A | Discharge status: Home / Self Care | Payer: BLUE CROSS/BLUE SHIELD | Attending: Emergency Medicine | Admitting: Emergency Medicine

## 2016-06-25 ENCOUNTER — Encounter (HOSPITAL_COMMUNITY): Payer: Self-pay | Admitting: *Deleted

## 2016-06-25 DIAGNOSIS — Z87891 Personal history of nicotine dependence: Secondary | ICD-10-CM | POA: Insufficient documentation

## 2016-06-25 DIAGNOSIS — Y999 Unspecified external cause status: Secondary | ICD-10-CM | POA: Insufficient documentation

## 2016-06-25 DIAGNOSIS — Y939 Activity, unspecified: Secondary | ICD-10-CM | POA: Insufficient documentation

## 2016-06-25 DIAGNOSIS — Y92410 Unspecified street and highway as the place of occurrence of the external cause: Secondary | ICD-10-CM | POA: Insufficient documentation

## 2016-06-25 DIAGNOSIS — M25561 Pain in right knee: Secondary | ICD-10-CM | POA: Insufficient documentation

## 2016-06-25 MED ORDER — METHOCARBAMOL 500 MG PO TABS: 500.0000 mg | ORAL_TABLET | Freq: Two times a day (BID) | ORAL | Status: DC

## 2016-06-25 MED ORDER — NAPROXEN 500 MG PO TABS: 500.0000 mg | ORAL_TABLET | Freq: Two times a day (BID) | ORAL | Status: DC
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
## 2016-06-25 NOTE — Discharge Instructions (Signed)
Take the prescribed medication as directed. Follow-up with Dr. Eulah PontMurphy if knee not improving over the next week. Return to the ED for new or worsening symptoms.

## 2016-06-25 NOTE — ED Notes (Signed)
Pt in X-ray when called back to a room. X-ray will transport pt to the room when finished.

## 2017-08-06 ENCOUNTER — Encounter (HOSPITAL_COMMUNITY): Payer: Self-pay

## 2017-08-06 ENCOUNTER — Emergency Department (HOSPITAL_COMMUNITY)
Admission: EM | Admit: 2017-08-06 | Discharge: 2017-08-06 | Disposition: A | Discharge status: Home / Self Care | Payer: BLUE CROSS/BLUE SHIELD | Attending: Emergency Medicine | Admitting: Emergency Medicine

## 2017-08-06 ENCOUNTER — Encounter (HOSPITAL_COMMUNITY): Payer: Self-pay | Admitting: Emergency Medicine

## 2017-08-06 DIAGNOSIS — R1110 Vomiting, unspecified: Secondary | ICD-10-CM

## 2017-08-06 DIAGNOSIS — R197 Diarrhea, unspecified: Secondary | ICD-10-CM | POA: Insufficient documentation

## 2017-08-06 DIAGNOSIS — Z79899 Other long term (current) drug therapy: Secondary | ICD-10-CM | POA: Insufficient documentation

## 2017-08-06 DIAGNOSIS — K5289 Other specified noninfective gastroenteritis and colitis: Secondary | ICD-10-CM | POA: Insufficient documentation

## 2017-08-06 DIAGNOSIS — Z87891 Personal history of nicotine dependence: Secondary | ICD-10-CM | POA: Insufficient documentation

## 2017-08-06 DIAGNOSIS — R112 Nausea with vomiting, unspecified: Secondary | ICD-10-CM | POA: Insufficient documentation

## 2017-08-06 DIAGNOSIS — Z7983 Long term (current) use of bisphosphonates: Secondary | ICD-10-CM | POA: Insufficient documentation

## 2017-08-06 LAB — CBC
HCT: 43.9 % (ref 39.0–52.0)
HEMOGLOBIN: 15.2 g/dL (ref 13.0–17.0)
MCH: 32.9 pg (ref 26.0–34.0)
MCHC: 34.6 g/dL (ref 30.0–36.0)
MCV: 95 fL (ref 78.0–100.0)
Platelets: 274 10*3/uL (ref 150–400)
RBC: 4.62 MIL/uL (ref 4.22–5.81)
RDW: 13.3 % (ref 11.5–15.5)
WBC: 5.9 10*3/uL (ref 4.0–10.5)

## 2017-08-06 LAB — COMPREHENSIVE METABOLIC PANEL
ALT: 30 U/L (ref 17–63)
ANION GAP: 7 (ref 5–15)
AST: 46 U/L — ABNORMAL HIGH (ref 15–41)
Albumin: 3.9 g/dL (ref 3.5–5.0)
Alkaline Phosphatase: 60 U/L (ref 38–126)
BILIRUBIN TOTAL: 0.9 mg/dL (ref 0.3–1.2)
BUN: 8 mg/dL (ref 6–20)
CHLORIDE: 101 mmol/L (ref 101–111)
CO2: 27 mmol/L (ref 22–32)
Calcium: 8.8 mg/dL — ABNORMAL LOW (ref 8.9–10.3)
Creatinine, Ser: 1.36 mg/dL — ABNORMAL HIGH (ref 0.61–1.24)
GFR calc Af Amer: >60 mL/min (ref >60)
Glucose, Bld: 119 mg/dL — ABNORMAL HIGH (ref 65–99)
Potassium: 4.3 mmol/L (ref 3.5–5.1)
Sodium: 135 mmol/L (ref 135–145)
TOTAL PROTEIN: 7.2 g/dL (ref 6.5–8.1)

## 2017-08-06 LAB — LIPASE, BLOOD: LIPASE: 81 U/L — AB (ref 11–51)

## 2017-08-06 MED ORDER — SODIUM CHLORIDE 0.9 % IV BOLUS (SEPSIS)
1000.0000 mL | Freq: Once | INTRAVENOUS | Status: AC
  Administered 2017-08-06: 1000 mL via INTRAVENOUS

## 2017-08-06 MED ORDER — ONDANSETRON HCL 4 MG/2ML IJ SOLN
4.0000 mg | Freq: Once | INTRAMUSCULAR | Status: AC
  Administered 2017-08-06: 4 mg via INTRAVENOUS
  Filled 2017-08-06: qty 2

## 2017-08-06 MED ORDER — ONDANSETRON 4 MG PO TBDP
4.0000 mg | ORAL_TABLET | Freq: Once | ORAL | Status: AC | PRN
  Administered 2017-08-06: 4 mg via ORAL

## 2017-08-06 MED ORDER — ONDANSETRON HCL 4 MG PO TABS: 4.0000 mg | ORAL_TABLET | Freq: Three times a day (TID) | ORAL | 0 refills | Status: DC | PRN

## 2017-08-06 MED ORDER — ONDANSETRON 4 MG PO TBDP
ORAL_TABLET | ORAL | Status: AC
  Filled 2017-08-06: qty 1

## 2017-08-06 NOTE — Discharge Instructions (Signed)
Your work up was reassuring today. Please continue to drink plenty of fluids. Call number provided to establish a PCM. Return to ED with any new or concerning symptoms.

## 2017-08-06 NOTE — ED Notes (Addendum)
Not sending urine,  Dr. Hinda GlatterMazaria said there was no need because his treatment was effective and he is getting discharged.

## 2017-08-06 NOTE — ED Notes (Signed)
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  Unable to wait for paper work.  Given discharge papers to the extent they were filled out prior to departure.  Patient ambulated to exit with steady gait.

## 2017-08-06 NOTE — ED Triage Notes (Signed)
Pt reports vomiting since Monday. He reports abd discomfort. Pt noted to have beads of sweat on his forehead. Pt alert and oriented but appears to be uncomfortable. Pt concerned for possible food poisoning.

## 2017-08-06 NOTE — ED Triage Notes (Signed)
Patient reports persistent mid/upper abdominal pain " bloated" with emesis and diarrhea for several days , denies fever or chills . Blood tests done today at triage but he left and returned this evening .

## 2017-08-06 NOTE — ED Provider Notes (Signed)
[facility]EMERGENCY DEPT Provider Note   CSN: [id number] Arrival date & time: 08/06/17  1410     History   Chief Complaint Chief Complaint  Patient presents with  . Emesis    HPI Donald Hurst is a 26 y.o. male. With no previous medical history who presents with 3 days of nausea, vomiting and abdominal bloating. Patient recalls cooking pork ribs, and thinks they might not have been cooked enough. He describes weakness, vomiting 2 times daily, belching, and upper abdominal pain. He also has had 2-3 episodes of diarrhea daily as well. Denies any fevers, chills, urinary symptoms.    HPI  History reviewed. No pertinent past medical history.  There are no active problems to display for this patient.   Past Surgical History:  Procedure Laterality Date  . ANKLE SURGERY    . SHOULDER SURGERY Right        Home Medications    Prior to Admission medications   Medication Sig Start Date End Date Taking? Authorizing Provider  diclofenac (VOLTAREN) 50 MG EC tablet Take 1 tablet (50 mg total) by mouth 2 (two) times daily. 04/30/16   Janne Napoleon, NP  ibuprofen (ADVIL,MOTRIN) 600 MG tablet Take 1 tablet (600 mg total) by mouth every 6 (six) hours as needed. 02/12/16   Danelle Berry, PA-C  methocarbamol (ROBAXIN) 500 MG tablet Take 1 tablet (500 mg total) by mouth 2 (two) times daily. 06/25/16   Garlon Hatchet, PA-C  naproxen (NAPROSYN) 500 MG tablet Take 1 tablet (500 mg total) by mouth 2 (two) times daily with a meal. 06/25/16   Garlon Hatchet, PA-C  ondansetron (ZOFRAN) 4 MG tablet Take 1 tablet (4 mg total) by mouth every 8 (eight) hours as needed for nausea or vomiting. 08/06/17   Wynelle Cleveland, MD  oxyCODONE-acetaminophen (PERCOCET) 7.5-325 MG tablet Take 1 tablet by mouth every 4 (four) hours as needed for severe pain. 04/30/16   Janne Napoleon, NP  polyethylene glycol (MIRALAX / GLYCOLAX) packet Take 17 g by mouth daily as needed. 11/14/15   Jamal Collin, MD    Family History History  reviewed. No pertinent family history.  Social History Social History  Substance Use Topics  . Smoking status: Former Games developer  . Smokeless tobacco: Never Used     Comment: marijuana  . Alcohol use Yes     Comment: occasionally     Allergies   Shrimp [shellfish allergy] and Tramadol   Review of Systems Review of Systems  Constitutional: Negative for chills and fever.  HENT: Negative for ear pain and sore throat.   Eyes: Negative for pain and visual disturbance.  Respiratory: Negative for cough and shortness of breath.   Cardiovascular: Negative for chest pain and palpitations.  Gastrointestinal: Positive for abdominal pain, diarrhea, nausea and vomiting.  Genitourinary: Negative for dysuria and hematuria.  Musculoskeletal: Negative for arthralgias and back pain.  Skin: Negative for color change and rash.  Neurological: Negative for seizures and syncope.  All other systems reviewed and are negative.    Physical Exam Updated Vital Signs BP 114/80 (BP Location: Right Arm)   Pulse 66   Temp 97.8 F (36.6 C) (Oral)   Resp 17   Ht 6\' 3"  (1.905 m)   Wt 124.7 kg (275 lb)   SpO2 100%   BMI 34.37 kg/m   Physical Exam  Constitutional: He appears well-developed and well-nourished.  HENT:  Head: Normocephalic and atraumatic.  Eyes: Conjunctivae are normal.  Neck: Neck supple.  Cardiovascular: Normal rate and regular rhythm.   No murmur heard. Pulmonary/Chest: Effort normal and breath sounds normal. No respiratory distress.  Abdominal: Soft. There is no tenderness.  Musculoskeletal: He exhibits no edema.  Neurological: He is alert.  Skin: Skin is warm and dry.  Psychiatric: He has a normal mood and affect.  Nursing note and vitals reviewed.    ED Treatments / Results  Labs (all labs ordered are listed, but only abnormal results are displayed) Labs Reviewed  LIPASE, BLOOD - Abnormal; Notable for the following:       Result Value   Lipase 81 (*)    All other  components within normal limits  COMPREHENSIVE METABOLIC PANEL - Abnormal; Notable for the following:    Glucose, Bld 119 (*)    Creatinine, Ser 1.36 (*)    Calcium 8.8 (*)    AST 46 (*)    All other components within normal limits  CBC    EKG  EKG Interpretation None       Radiology No results found.  Procedures Procedures (including critical care time)  Medications Ordered in ED Medications  ondansetron (ZOFRAN-ODT) disintegrating tablet 4 mg (4 mg Oral Given 08/06/17 1428)  sodium chloride 0.9 % bolus 1,000 mL (0 mLs Intravenous Stopped 08/06/17 2030)  ondansetron (ZOFRAN) injection 4 mg (4 mg Intravenous Given 08/06/17 2017)     Initial Impression / Assessment and Plan / ED Course  I have reviewed the triage vital signs and the nursing notes.  Pertinent labs & imaging results that were available during my care of the patient were reviewed by me and considered in my medical decision making (see chart for details).     Patient arrived hemodynamically stable in no acute distress. Improvement in symptoms after Zofran given in triage. Exam as above, significant for benign abdominal exam. Labs essentially stable. Mildly elevated lipase but benign abdominal exam. Doubt pancreatitis at this time. Mildly elevated Cr, but consistent with decreased po intake in setting of vomiting and diarrhea. Doubt any acute abdominal process at this time. Patient treated with 1 L IV fluids and additional dose of Zofran, with improvement of symptoms. He was able to tolerate a sandwich without issue. Discussed close follow-up with the primary care doctor. Return precautions discussed. Patient agreement with plan at time of discharge.  Patient and plan of care discussed with Attending physician, Dr. Jacqulyn BathLong.    Final Clinical Impressions(s) / ED Diagnoses   Final diagnoses:  Vomiting and diarrhea    New Prescriptions Discharge Medication List as of 08/06/2017  9:06 PM    START taking these  medications   Details  ondansetron (ZOFRAN) 4 MG tablet Take 1 tablet (4 mg total) by mouth every 8 (eight) hours as needed for nausea or vomiting., Starting Thu 08/06/2017, Print         Wynelle ClevelandMizera, Tresea Heine, MD 08/07/17 0121    Maia PlanLong, Joshua G, MD 08/07/17 [id number]

## 2017-08-07 ENCOUNTER — Emergency Department (HOSPITAL_COMMUNITY)
Admission: EM | Admit: 2017-08-07 | Discharge: 2017-08-07 | Disposition: A | Discharge status: Home / Self Care | Payer: Self-pay | Attending: Emergency Medicine | Admitting: Emergency Medicine

## 2017-08-07 ENCOUNTER — Emergency Department (HOSPITAL_COMMUNITY): Payer: Self-pay

## 2017-08-07 DIAGNOSIS — K529 Noninfective gastroenteritis and colitis, unspecified: Secondary | ICD-10-CM

## 2017-08-07 LAB — COMPREHENSIVE METABOLIC PANEL
ALBUMIN: 3.6 g/dL (ref 3.5–5.0)
ALT: 27 U/L (ref 17–63)
ANION GAP: 6 (ref 5–15)
AST: 38 U/L (ref 15–41)
Alkaline Phosphatase: 58 U/L (ref 38–126)
BUN: 7 mg/dL (ref 6–20)
CHLORIDE: 103 mmol/L (ref 101–111)
CO2: 27 mmol/L (ref 22–32)
Calcium: 8.7 mg/dL — ABNORMAL LOW (ref 8.9–10.3)
Creatinine, Ser: 1.38 mg/dL — ABNORMAL HIGH (ref 0.61–1.24)
GFR calc Af Amer: >60 mL/min (ref >60)
GFR calc non Af Amer: >60 mL/min (ref >60)
GLUCOSE: 95 mg/dL (ref 65–99)
POTASSIUM: 3.8 mmol/L (ref 3.5–5.1)
SODIUM: 136 mmol/L (ref 135–145)
Total Bilirubin: 0.5 mg/dL (ref 0.3–1.2)
Total Protein: 6.5 g/dL (ref 6.5–8.1)

## 2017-08-07 LAB — URINALYSIS, ROUTINE W REFLEX MICROSCOPIC
BILIRUBIN URINE: NEGATIVE
Glucose, UA: NEGATIVE mg/dL
Hgb urine dipstick: NEGATIVE
KETONES UR: NEGATIVE mg/dL
NITRITE: NEGATIVE
Protein, ur: NEGATIVE mg/dL
Specific Gravity, Urine: 1.01 (ref 1.005–1.030)
Squamous Epithelial / LPF: NONE SEEN
pH: 6 (ref 5.0–8.0)

## 2017-08-07 LAB — CBC WITH DIFFERENTIAL/PLATELET
BASOS PCT: 0 %
Basophils Absolute: 0 10*3/uL (ref 0.0–0.1)
EOS ABS: 0.2 10*3/uL (ref 0.0–0.7)
Eosinophils Relative: 2 %
HCT: 39.3 % (ref 39.0–52.0)
Hemoglobin: 13.1 g/dL (ref 13.0–17.0)
Lymphocytes Relative: 24 %
Lymphs Abs: 1.6 10*3/uL (ref 0.7–4.0)
MCH: 31.9 pg (ref 26.0–34.0)
MCHC: 33.3 g/dL (ref 30.0–36.0)
MCV: 95.6 fL (ref 78.0–100.0)
MONOS PCT: 9 %
Monocytes Absolute: 0.6 10*3/uL (ref 0.1–1.0)
NEUTROS PCT: 65 %
Neutro Abs: 4.3 10*3/uL (ref 1.7–7.7)
Platelets: 259 10*3/uL (ref 150–400)
RBC: 4.11 MIL/uL — ABNORMAL LOW (ref 4.22–5.81)
RDW: 13.5 % (ref 11.5–15.5)
WBC: 6.7 10*3/uL (ref 4.0–10.5)

## 2017-08-07 LAB — LIPASE, BLOOD: Lipase: 384 U/L — ABNORMAL HIGH (ref 11–51)

## 2017-08-07 MED ORDER — METRONIDAZOLE 500 MG PO TABS: 500.0000 mg | ORAL_TABLET | Freq: Two times a day (BID) | ORAL | 0 refills | Status: DC

## 2017-08-07 MED ORDER — CIPROFLOXACIN IN D5W 400 MG/200ML IV SOLN
400.0000 mg | Freq: Once | INTRAVENOUS | Status: AC
  Administered 2017-08-07: 400 mg via INTRAVENOUS
  Filled 2017-08-07: qty 200

## 2017-08-07 MED ORDER — IOPAMIDOL (ISOVUE-300) INJECTION 61%
INTRAVENOUS | Status: AC
  Administered 2017-08-07: 100 mL
  Filled 2017-08-07: qty 100

## 2017-08-07 MED ORDER — KETOROLAC TROMETHAMINE 30 MG/ML IJ SOLN
30.0000 mg | Freq: Once | INTRAMUSCULAR | Status: AC
  Administered 2017-08-07: 30 mg via INTRAVENOUS
  Filled 2017-08-07: qty 1

## 2017-08-07 MED ORDER — ONDANSETRON HCL 4 MG/2ML IJ SOLN
4.0000 mg | Freq: Once | INTRAMUSCULAR | Status: AC
  Administered 2017-08-07: 4 mg via INTRAVENOUS
  Filled 2017-08-07: qty 2

## 2017-08-07 MED ORDER — METRONIDAZOLE IN NACL 5-0.79 MG/ML-% IV SOLN
500.0000 mg | Freq: Once | INTRAVENOUS | Status: AC
  Administered 2017-08-07: 500 mg via INTRAVENOUS
  Filled 2017-08-07: qty 100

## 2017-08-07 MED ORDER — SODIUM CHLORIDE 0.9 % IV BOLUS (SEPSIS)
1000.0000 mL | Freq: Once | INTRAVENOUS | Status: AC
  Administered 2017-08-07: 1000 mL via INTRAVENOUS

## 2017-08-07 MED ORDER — CIPROFLOXACIN HCL 500 MG PO TABS: 500.0000 mg | ORAL_TABLET | Freq: Two times a day (BID) | ORAL | 0 refills | Status: DC

## 2017-08-07 NOTE — Discharge Instructions (Signed)
You have been diagnosed with a intestinal infection (Enteritis).  Although it is likely a virus causing your infection, it may also be due to a bacterial infection.  Please take antibiotics as prescribed for the full duration.  Stay hydrated and eat bland food. Return if your condition worsen or if you have other concerns.

## 2017-08-07 NOTE — ED Notes (Signed)
Patient transported to CT 

## 2017-08-07 NOTE — ED Provider Notes (Signed)
[facility] Provider Note   CSN: [id number] Arrival date & time: 08/06/17  2335     History   Chief Complaint Chief Complaint  Patient presents with  . Abdominal Pain    HPI Donald Hurst is a 26 y.o. male.  HPI   26 year old male without a significant past medical history presenting for evaluation of abdominal discomfort.The past 3-4 days patient has had abdominal bloating with associate nausea and vomiting. All this started after he recall cooking pork ribs and thought that he may not have complete long enough. Pain is located to his upper abdomen, with associated nonbloody nonbilious vomiting several times daily with associate diarrhea as well.pain is described as a crampy sensation, now 10 out of 10. He was seen in the ER yesterday for this complaint. After receiving treatment, he felt mildly improved but have to leave to go to work. After shown up to work last night he stayed for approximately 40 minutes but his discomfort became too significant and patient returned back to the ER. Currently denies having fever, chills, URI symptoms, back pain, dysuria, hematuria, penile discharge, or rash.  History reviewed. No pertinent past medical history.  There are no active problems to display for this patient.   Past Surgical History:  Procedure Laterality Date  . ANKLE SURGERY    . SHOULDER SURGERY Right        Home Medications    Prior to Admission medications   Medication Sig Start Date End Date Taking? Authorizing Provider  diclofenac (VOLTAREN) 50 MG EC tablet Take 1 tablet (50 mg total) by mouth 2 (two) times daily. 04/30/16   Janne Napoleon, NP  ibuprofen (ADVIL,MOTRIN) 600 MG tablet Take 1 tablet (600 mg total) by mouth every 6 (six) hours as needed. 02/12/16   Danelle Berry, PA-C  methocarbamol (ROBAXIN) 500 MG tablet Take 1 tablet (500 mg total) by mouth 2 (two) times daily. 06/25/16   Garlon Hatchet, PA-C  naproxen (NAPROSYN) 500 MG tablet Take 1 tablet (500 mg  total) by mouth 2 (two) times daily with a meal. 06/25/16   Garlon Hatchet, PA-C  ondansetron (ZOFRAN) 4 MG tablet Take 1 tablet (4 mg total) by mouth every 8 (eight) hours as needed for nausea or vomiting. 08/06/17   Wynelle Cleveland, MD  oxyCODONE-acetaminophen (PERCOCET) 7.5-325 MG tablet Take 1 tablet by mouth every 4 (four) hours as needed for severe pain. 04/30/16   Janne Napoleon, NP  polyethylene glycol (MIRALAX / GLYCOLAX) packet Take 17 g by mouth daily as needed. 11/14/15   Jamal Collin, MD    Family History No family history on file.  Social History Social History  Substance Use Topics  . Smoking status: Former Games developer  . Smokeless tobacco: Never Used     Comment: marijuana  . Alcohol use Yes     Comment: occasionally     Allergies   Shrimp [shellfish allergy] and Tramadol   Review of Systems Review of Systems  All other systems reviewed and are negative.    Physical Exam Updated Vital Signs BP 121/71   Pulse 64   Temp 98.7 F (37.1 C) (Oral)   Resp 16   Ht 6\' 3"  (1.905 m)   Wt 124.7 kg (275 lb)   SpO2 98%   BMI 34.37 kg/m   Physical Exam  Constitutional: He appears well-developed and well-nourished. No distress.  HENT:  Head: Atraumatic.  Eyes: Conjunctivae are normal.  Neck: Neck supple.  Cardiovascular: Normal rate  and regular rhythm.   Pulmonary/Chest: Effort normal and breath sounds normal.  Abdominal: Soft. Bowel sounds are normal. He exhibits no distension. There is tenderness (mild epigastric tenderness without guarding or rebound tenderness. Negative Murphy sign, no pain at McBurney's point).  Neurological: He is alert.  Skin: No rash noted.  Psychiatric: He has a normal mood and affect.  Nursing note and vitals reviewed.    ED Treatments / Results  Labs (all labs ordered are listed, but only abnormal results are displayed) Labs Reviewed  URINALYSIS, ROUTINE W REFLEX MICROSCOPIC - Abnormal; Notable for the following:       Result Value    Leukocytes, UA MODERATE (*)    Bacteria, UA RARE (*)    All other components within normal limits  CBC WITH DIFFERENTIAL/PLATELET - Abnormal; Notable for the following:    RBC 4.11 (*)    All other components within normal limits  COMPREHENSIVE METABOLIC PANEL - Abnormal; Notable for the following:    Creatinine, Ser 1.38 (*)    Calcium 8.7 (*)    All other components within normal limits  LIPASE, BLOOD - Abnormal; Notable for the following:    Lipase 384 (*)    All other components within normal limits    EKG  EKG Interpretation None       Radiology Ct Abdomen Pelvis W Contrast  Result Date: 08/07/2017 CLINICAL DATA:  26 year old with persistent mid to upper abdominal wall pain, bloating, vomiting and diarrhea for several days. EXAM: CT ABDOMEN AND PELVIS WITH CONTRAST TECHNIQUE: Multidetector CT imaging of the abdomen and pelvis was performed using the standard protocol following bolus administration of intravenous contrast. CONTRAST:  ISOVUE-300 IOPAMIDOL (ISOVUE-300) INJECTION 61% COMPARISON:  None. FINDINGS: Examination was performed with the patient prone (his request). Lower chest: The heart appears mildly enlarged. The lung bases are clear. There is no pleural or pericardial effusion. Hepatobiliary: The liver is normal in density without focal abnormality. No evidence of gallstones, gallbladder wall thickening or biliary dilatation. Pancreas: Unremarkable. No pancreatic ductal dilatation or surrounding inflammatory changes. Spleen: Normal in size without focal abnormality. Adrenals/Urinary Tract: Both adrenal glands appear normal. Both kidneys appear normal. There is no evidence of urinary tract calculus or hydronephrosis. The bladder is nearly empty and suboptimally evaluated. Stomach/Bowel: No enteric contrast was administered. The stomach and proximal small bowel appear normal. There is diffuse mid to distal small bowel wall thickening, especially within the left mid  abdomen. The colon is fluid-filled without wall thickening or distention. There is edema and ill-defined fluid throughout the mesenteric fat. No focal extraluminal air or fluid collections are seen. The appendix appears normal. Vascular/Lymphatic: No significant vascular findings. The infrarenal IVC is suboptimally opacified. No enlarged abdominopelvic lymph nodes. Reproductive: The prostate gland and seminal vesicles appear normal. Other: As above, there is mesenteric edema and ill-defined fluid. There is a small amount of pelvic ascites. No evidence of abdominal wall hernia. Musculoskeletal: No acute or significant osseous findings. IMPRESSION: 1. Diffuse mid to distal small bowel wall thickening with surrounding mesenteric inflammation, ill-defined fluid and ascites. Findings suggest enteritis, likely infectious. 2. No evidence of bowel obstruction, definite perforation or abscess. The appendix appears normal. 3. No evidence of large vessel occlusion or aneurysm. Electronically Signed   By: Carey Bullocks M.D.   On: 08/07/2017 10:55    Procedures Procedures (including critical care time)  Medications Ordered in ED Medications  ondansetron (ZOFRAN) injection 4 mg (4 mg Intravenous Given 08/07/17 0804)  sodium chloride 0.9 %  bolus 1,000 mL (0 mLs Intravenous Stopped 08/07/17 0944)  ketorolac (TORADOL) 30 MG/ML injection 30 mg (30 mg Intravenous Given 08/07/17 0804)  iopamidol (ISOVUE-300) 61 % injection (100 mLs  Contrast Given 08/07/17 1028)  ciprofloxacin (CIPRO) IVPB 400 mg (0 mg Intravenous Stopped 08/07/17 1416)  metroNIDAZOLE (FLAGYL) IVPB 500 mg (500 mg Intravenous New Bag/Given 08/07/17 1300)     Initial Impression / Assessment and Plan / ED Course  I have reviewed the triage vital signs and the nursing notes.  Pertinent labs & imaging results that were available during my care of the patient were reviewed by me and considered in my medical decision making (see chart for details).     BP  102/66   Pulse 63   Temp 98.7 F (37.1 C) (Oral)   Resp 16   Ht 6\' 3"  (1.905 m)   Wt 124.7 kg (275 lb)   SpO2 99%   BMI 34.37 kg/m    Final Clinical Impressions(s) / ED Diagnoses   Final diagnoses:  Enteritis    New Prescriptions New Prescriptions   CIPROFLOXACIN (CIPRO) 500 MG TABLET    Take 1 tablet (500 mg total) by mouth 2 (two) times daily. One po bid x 7 days   METRONIDAZOLE (FLAGYL) 500 MG TABLET    Take 1 tablet (500 mg total) by mouth 2 (two) times daily.   7:26 AM Patient here with recurrent upper abdominal pain with associate nausea vomiting diarrhea. Symptoms suggestive of viral etiology. He was seen in ED yesterday for the same complaint discharge but came back. Does not have any significant right lower quadrant abdominal pain to suggest appendicitis. Negative Murphy sign, no suspicion for biliary disease. Will recheck his labs, provide symptomatically treatment and will reassess.   9:39 AM Patient has mildly elevated lipase of 81 yesterday however today his lipase is 384. Normal WBC. Pain is currently controlled. Renal function is mildly impaired with a creatinine of 1.38. Will continue with IV fluid. Patient will benefit from an abdominal and pelvis CT scan to assess for potential acute pancreatitis.    2:49 PM Abd/pelvis CT scan shows no evidence of pancreatitis.  There are diffuse mid to distal small bowerl wall thickening with surrounding mesenteric inflammation, ill-defined fluid and ascites.  Findings suggest enteritis, likely infectious.    Although this finding likely suggest a viral infection, bacterial infection can not be excluded.  Given the duration of his sxs, pt will be treated with cipro/flagyl for infection.  Return precaution discussed.  Pt also received abx and IVF while in the ER and felt better.     Fayrene Helper, PA-C 08/07/17 1451    Tegeler, Canary Brim, MD 08/08/17 [id number]

## 2018-07-21 ENCOUNTER — Emergency Department (HOSPITAL_COMMUNITY)
Admission: EM | Admit: 2018-07-21 | Discharge: 2018-07-21 | Disposition: A | Discharge status: Home / Self Care | Payer: BLUE CROSS/BLUE SHIELD | Attending: Emergency Medicine | Admitting: Emergency Medicine

## 2018-07-21 ENCOUNTER — Other Ambulatory Visit: Payer: Self-pay

## 2018-07-21 ENCOUNTER — Encounter (HOSPITAL_COMMUNITY): Payer: Self-pay | Admitting: Emergency Medicine

## 2018-07-21 ENCOUNTER — Emergency Department (HOSPITAL_COMMUNITY): Payer: BLUE CROSS/BLUE SHIELD

## 2018-07-21 DIAGNOSIS — X500XXA Overexertion from strenuous movement or load, initial encounter: Secondary | ICD-10-CM | POA: Insufficient documentation

## 2018-07-21 DIAGNOSIS — S63652A Sprain of metacarpophalangeal joint of right middle finger, initial encounter: Secondary | ICD-10-CM

## 2018-07-21 DIAGNOSIS — Y929 Unspecified place or not applicable: Secondary | ICD-10-CM | POA: Insufficient documentation

## 2018-07-21 DIAGNOSIS — Y9367 Activity, basketball: Secondary | ICD-10-CM | POA: Insufficient documentation

## 2018-07-21 DIAGNOSIS — Y999 Unspecified external cause status: Secondary | ICD-10-CM | POA: Insufficient documentation

## 2018-07-21 DIAGNOSIS — Z87891 Personal history of nicotine dependence: Secondary | ICD-10-CM | POA: Insufficient documentation

## 2018-07-21 MED ORDER — NAPROXEN 250 MG PO TABS
500.0000 mg | ORAL_TABLET | Freq: Once | ORAL | Status: AC
  Administered 2018-07-21: 500 mg via ORAL
  Filled 2018-07-21: qty 2

## 2018-07-21 MED ORDER — NAPROXEN 500 MG PO TABS: 500.0000 mg | ORAL_TABLET | Freq: Two times a day (BID) | ORAL | 0 refills | Status: DC

## 2018-07-21 NOTE — ED Triage Notes (Signed)
Patient complains of pain in right hand after injury while playing basketball a few weeks ago. Patient states he cannot recall specifics of injury, only that he hurt it while playing. Patient alert, oriented, and in no apparent distress at this time.

## 2018-07-21 NOTE — Discharge Instructions (Signed)
Please read and follow all provided instructions.  Your diagnoses today include:  1. Sprain of metacarpophalangeal (MCP) joint of right middle finger, initial encounter     Tests performed today include:  An x-ray of the affected area - does NOT show any broken bones  Vital signs. See below for your results today.   Medications prescribed:   Naproxen - anti-inflammatory pain medication  Do not exceed 500mg  naproxen every 12 hours, take with food  You have been prescribed an anti-inflammatory medication or NSAID. Take with food. Take smallest effective dose for the shortest duration needed for your pain. Stop taking if you experience stomach pain or vomiting.   Take any prescribed medications only as directed.  Home care instructions:   Follow any educational materials contained in this packet  Follow R.I.C.E. Protocol:  R - rest your injury   I  - use ice on injury without applying directly to skin  C - compress injury with bandage or splint  E - elevate the injury as much as possible  Follow-up instructions: Please follow-up with your primary care provider if you continue to have significant pain in 1 week. In this case you may have a more severe injury that requires further care.   Return instructions:   Please return if your fingers are numb or tingling, appear gray or blue, or you have severe pain (also elevate the arm and loosen splint or wrap if you were given one)  Please return to the Emergency Department if you experience worsening symptoms.   Please return if you have any other emergent concerns.  Additional Information:  Your vital signs today were: BP (!) 131/92 (BP Location: Right Arm)    Pulse 85    Temp (!) 97.2 F (36.2 C) (Oral)    Resp 16    SpO2 100%  If your blood pressure (BP) was elevated above 135/85 this visit, please have this repeated by your doctor within one month. --------------

## 2018-07-21 NOTE — ED Notes (Signed)
Declined W/C at D/C and was escorted to lobby by RN. 

## 2018-07-21 NOTE — ED Provider Notes (Signed)
[facility]   CSN: [id number] Arrival date & time: 07/21/18  1451     History   Chief Complaint Chief Complaint  Patient presents with  . Hand Injury    HPI Donald Hurst is a 27 y.o. male.  Patient presents the emergency department with complaints of right middle finger injury sustained while playing basketball about 2 weeks ago.  He states that he fell and hyperextended the finger.  He continues to have pain.  No treatments prior to arrival.  No numbness or tingling.  No wrist, elbow, shoulder pain.  Onset of symptoms acute.  Course is persistent.  Movement and palpation makes the pain worse.  Nothing makes it better.     History reviewed. No pertinent past medical history.  There are no active problems to display for this patient.   Past Surgical History:  Procedure Laterality Date  . ANKLE SURGERY    . SHOULDER SURGERY Right         Home Medications    Prior to Admission medications   Medication Sig Start Date End Date Taking? Authorizing Provider  ciprofloxacin (CIPRO) 500 MG tablet Take 1 tablet (500 mg total) by mouth 2 (two) times daily. One po bid x 7 days 08/07/17   Fayrene Helperran, [place], PA-C  metroNIDAZOLE (FLAGYL) 500 MG tablet Take 1 tablet (500 mg total) by mouth 2 (two) times daily. 08/07/17   Fayrene Helperran, [place], PA-C  naproxen (NAPROSYN) 500 MG tablet Take 1 tablet (500 mg total) by mouth 2 (two) times daily. 07/21/18   Renne CriglerGeiple, Dorian Duval, PA-C    Family History No family history on file.  Social History Social History   Tobacco Use  . Smoking status: Former Games developermoker  . Smokeless tobacco: Never Used  . Tobacco comment: marijuana  Substance Use Topics  . Alcohol use: Yes    Comment: occasionally  . Drug use: Yes    Types: Marijuana     Allergies   Shrimp [shellfish allergy] and Tramadol   Review of Systems Review of Systems  Constitutional: Negative for activity change.  Musculoskeletal: Positive for  arthralgias and joint swelling. Negative for back pain, gait problem and neck pain.  Skin: Negative for wound.  Neurological: Negative for weakness and numbness.     Physical Exam Updated Vital Signs BP (!) 131/92 (BP Location: Right Arm)   Pulse 85   Temp (!) 97.2 F (36.2 C) (Oral)   Resp 16   SpO2 100%   Physical Exam  Constitutional: He appears well-developed and well-nourished.  HENT:  Head: Normocephalic and atraumatic.  Eyes: Conjunctivae are normal.  Neck: Normal range of motion. Neck supple.  Cardiovascular: Normal pulses. Exam reveals no decreased pulses.  Musculoskeletal: He exhibits tenderness. He exhibits no edema.       Hands: Neurological: He is alert. No sensory deficit.  Motor, sensation, and vascular distal to the injury is fully intact.   Skin: Skin is warm and dry.  Psychiatric: He has a normal mood and affect.  Nursing note and vitals reviewed.    ED Treatments / Results  Labs (all labs ordered are listed, but only abnormal results are displayed) Labs Reviewed - No data to display  EKG None  Radiology Dg Hand Complete Right  Result Date: 07/21/2018 CLINICAL DATA:  Fall 3 weeks ago.  Index finger pain. EXAM: RIGHT HAND - COMPLETE 3+ VIEW COMPARISON:  None. FINDINGS: There is no evidence of fracture or dislocation. There is no evidence of  arthropathy or other focal bone abnormality. Soft tissues are unremarkable. IMPRESSION: No acute fracture or dislocation of the right hand. Electronically Signed   By: Deatra Robinson M.D.   On: 07/21/2018 15:38    Procedures Procedures (including critical care time)  Medications Ordered in ED Medications  naproxen (NAPROSYN) tablet 500 mg (has no administration in time range)     Initial Impression / Assessment and Plan / ED Course  I have reviewed the triage vital signs and the nursing notes.  Pertinent labs & imaging results that were available during my care of the patient were reviewed by me and  considered in my medical decision making (see chart for details).     Patient seen and examined. Medications ordered.   Vital signs reviewed and are as follows: BP (!) 131/92 (BP Location: Right Arm)   Pulse 85   Temp (!) 97.2 F (36.2 C) (Oral)   Resp 16   SpO2 100%   4:05 PM Patient was counseled on RICE protocol and told to rest injury, use ice for no longer than 15 minutes every hour, compress the area, and elevate above the level of their heart as much as possible to reduce swelling. Questions answered. Patient verbalized understanding.     Final Clinical Impressions(s) / ED Diagnoses   Final diagnoses:  Sprain of metacarpophalangeal (MCP) joint of right middle finger, initial encounter   Patient with finger sprain, negative imaging.  NSAIDs, rice protocol indicated.  Encouraged PCP follow-up and 1 to 2 weeks if not improving.  ED Discharge Orders        Ordered    naproxen (NAPROSYN) 500 MG tablet  2 times daily     07/21/18 1601       Renne Crigler, PA-C 07/21/18 1605    Mesner, Barbara Cower, MD 07/22/18 1501

## 2018-10-21 ENCOUNTER — Emergency Department (HOSPITAL_COMMUNITY)
Admission: EM | Admit: 2018-10-21 | Discharge: 2018-10-22 | Disposition: A | Discharge status: Home / Self Care | Payer: Self-pay | Attending: Emergency Medicine | Admitting: Emergency Medicine

## 2018-10-21 ENCOUNTER — Encounter (HOSPITAL_COMMUNITY): Payer: Self-pay | Admitting: Emergency Medicine

## 2018-10-21 ENCOUNTER — Emergency Department (HOSPITAL_COMMUNITY): Payer: Self-pay

## 2018-10-21 DIAGNOSIS — T148XXA Other injury of unspecified body region, initial encounter: Secondary | ICD-10-CM

## 2018-10-21 DIAGNOSIS — Z87891 Personal history of nicotine dependence: Secondary | ICD-10-CM | POA: Insufficient documentation

## 2018-10-21 DIAGNOSIS — W25XXXA Contact with sharp glass, initial encounter: Secondary | ICD-10-CM | POA: Insufficient documentation

## 2018-10-21 DIAGNOSIS — Z79899 Other long term (current) drug therapy: Secondary | ICD-10-CM | POA: Insufficient documentation

## 2018-10-21 DIAGNOSIS — Y939 Activity, unspecified: Secondary | ICD-10-CM | POA: Insufficient documentation

## 2018-10-21 DIAGNOSIS — S60351A Superficial foreign body of right thumb, initial encounter: Secondary | ICD-10-CM | POA: Insufficient documentation

## 2018-10-21 DIAGNOSIS — Y929 Unspecified place or not applicable: Secondary | ICD-10-CM | POA: Insufficient documentation

## 2018-10-21 DIAGNOSIS — Y999 Unspecified external cause status: Secondary | ICD-10-CM | POA: Insufficient documentation

## 2018-10-21 NOTE — ED Notes (Signed)
Called for room x1.no answer. 

## 2018-10-21 NOTE — ED Triage Notes (Signed)
Pt arrives with "piece of android glass" in R thumb for approx 1 month. No meds pta.

## 2018-10-22 ENCOUNTER — Emergency Department (HOSPITAL_COMMUNITY): Payer: Self-pay

## 2018-10-22 MED ORDER — CEPHALEXIN 500 MG PO CAPS: 500.0000 mg | ORAL_CAPSULE | Freq: Four times a day (QID) | ORAL | 0 refills | Status: DC

## 2018-10-22 NOTE — Discharge Instructions (Signed)
Please read and follow all provided instructions.  Your diagnoses today include:  1. Foreign body in skin     Tests performed today include:  An x-ray of the affected area  Vital signs. See below for your results today.   Medications prescribed:   Keflex (cephalexin) - antibiotic  You have been prescribed an antibiotic medicine: take the entire course of medicine even if you are feeling better. Stopping early can cause the antibiotic not to work.  Take any prescribed medications only as directed.  Home care instructions:   Follow any educational materials contained in this packet  Follow R.I.C.E. Protocol:  R - rest your injury   I  - use ice on injury without applying directly to skin  C - compress injury with bandage or splint  E - elevate the injury as much as possible  Follow-up instructions: Please follow-up with the provided orthopedic physician in 1 week.   Return instructions:   Please return if your fingers are numb or tingling, appear gray or blue, or you have severe pain (also elevate the arm and loosen splint or wrap if you were given one)  Please return to the Emergency Department if you experience worsening symptoms.   Please return if you have any other emergent concerns.  Additional Information:  Your vital signs today were: BP (!) 131/102 (BP Location: Right Arm)    Pulse 89    Temp 98.9 F (37.2 C) (Oral)    Resp 16    SpO2 94%  If your blood pressure (BP) was elevated above 135/85 this visit, please have this repeated by your doctor within one month. --------------

## 2018-10-22 NOTE — ED Notes (Signed)
ED Provider at bedside. 

## 2018-10-22 NOTE — ED Provider Notes (Signed)
[facility] EMERGENCY DEPARTMENT Provider Note   CSN: [id number] Arrival date & time: 10/21/18  2054     History   Chief Complaint Chief Complaint  Patient presents with  . Foreign Body in Skin    R thumb - piece of gass x1 month    HPI Donald Hurst is a 27 y.o. male.  Patient presents with complaint of foreign body in his right thumb for about 1 month.  Patient has a sliver of glass from a broken phone screen.  Patient developed swelling over the area and worsening pain over the past few days.  No treatments prior to arrival.  He denies any drainage.  No fevers.  He still able to use his hand and grip but has pain.     History reviewed. No pertinent past medical history.  There are no active problems to display for this patient.   Past Surgical History:  Procedure Laterality Date  . ANKLE SURGERY    . SHOULDER SURGERY Right         Home Medications    Prior to Admission medications   Medication Sig Start Date End Date Taking? Authorizing Provider  cephALEXin (KEFLEX) 500 MG capsule Take 1 capsule (500 mg total) by mouth 4 (four) times daily. 10/22/18   Renne Crigler, PA-C  ciprofloxacin (CIPRO) 500 MG tablet Take 1 tablet (500 mg total) by mouth 2 (two) times daily. One po bid x 7 days 08/07/17   Fayrene Helper, PA-C  metroNIDAZOLE (FLAGYL) 500 MG tablet Take 1 tablet (500 mg total) by mouth 2 (two) times daily. 08/07/17   Fayrene Helper, PA-C  naproxen (NAPROSYN) 500 MG tablet Take 1 tablet (500 mg total) by mouth 2 (two) times daily. 07/21/18   Renne Crigler, PA-C    Family History History reviewed. No pertinent family history.  Social History Social History   Tobacco Use  . Smoking status: Former Games developer  . Smokeless tobacco: Never Used  . Tobacco comment: marijuana  Substance Use Topics  . Alcohol use: Yes    Comment: occasionally  . Drug use: Yes    Types: Marijuana     Allergies   Shrimp [shellfish allergy] and Tramadol   Review  of Systems Review of Systems  Constitutional: Negative for activity change.  Musculoskeletal: Positive for arthralgias. Negative for joint swelling.  Skin: Negative for color change and wound.  Neurological: Negative for weakness and numbness.     Physical Exam Updated Vital Signs BP (!) 131/102 (BP Location: Right Arm)   Pulse 89   Temp 98.9 F (37.2 C) (Oral)   Resp 16   SpO2 94%   Physical Exam  Constitutional: He appears well-developed and well-nourished.  HENT:  Head: Normocephalic and atraumatic.  Eyes: Conjunctivae are normal.  Neck: Normal range of motion. Neck supple.  Cardiovascular: Normal pulses. Exam reveals no decreased pulses.  Musculoskeletal: He exhibits tenderness. He exhibits no edema.  Nodular swelling of the right thumb at the IP joint.  Area is very firm.  I do not visualize or palpate any foreign bodies.  Neurological: He is alert. No sensory deficit.  Motor, sensation, and vascular distal to the injury is fully intact.   Skin: Skin is warm and dry.  Psychiatric: He has a normal mood and affect.  Nursing note and vitals reviewed.    ED Treatments / Results  Labs (all labs ordered are listed, but only abnormal results are displayed) Labs Reviewed - No data to display  EKG  None  Radiology No results found.  Procedures Procedures (including critical care time)  Medications Ordered in ED Medications - No data to display   Initial Impression / Assessment and Plan / ED Course  I have reviewed the triage vital signs and the nursing notes.  Pertinent labs & imaging results that were available during my care of the patient were reviewed by me and considered in my medical decision making (see chart for details).     Patient seen and examined.  X-ray ordered.  Anticipate discharged home with orthopedic hand follow-up.  Given worsening pain, feel that a course of antibiotics is reasonable.  Vital signs reviewed and are as follows: BP (!)  131/102 (BP Location: Right Arm)   Pulse 89   Temp 98.9 F (37.2 C) (Oral)   Resp 16   SpO2 94%   Patient updated on results.  Discharged home as plan as above.  Encouraged hand follow-up.  Encouraged return with worsening pain, swelling, redness, drainage as this could indicate abscess and need for I&D in the ED.  Final Clinical Impressions(s) / ED Diagnoses   Final diagnoses:  Foreign body in skin   Patient with reactive changes of the skin likely due to retained foreign body.  No definite infection however given worsening pain, feel course of antibiotics is warranted.  I do not feel comfortable incising the area as the foreign body is likely very small.  I do not want to cause any injury to the finger or underlying structures.  I will have him follow-up with orthopedic hand surgeon for further evaluation and advice.  ED Discharge Orders         Ordered    cephALEXin (KEFLEX) 500 MG capsule  4 times daily     10/22/18 0105           Renne Crigler, PA-C 10/22/18 0115    Geoffery Lyons, MD 10/22/18 [phone]

## 2018-11-01 ENCOUNTER — Telehealth: Payer: Self-pay

## 2018-11-01 NOTE — Telephone Encounter (Signed)
CSW received phone call from pt. Pt misplaced AVS. Pt requested phone number for orthopedic surgeon on AVS paperwork. CSW provided phone number.   Montine Circle, [facility]  [phone]

## 2018-11-21 DIAGNOSIS — S60351A Superficial foreign body of right thumb, initial encounter: Secondary | ICD-10-CM

## 2018-11-21 HISTORY — DX: Superficial foreign body of right thumb, initial encounter: S60.351A

## 2018-11-23 ENCOUNTER — Other Ambulatory Visit: Payer: Self-pay

## 2018-11-23 ENCOUNTER — Other Ambulatory Visit: Payer: Self-pay | Admitting: Orthopedic Surgery

## 2018-11-23 ENCOUNTER — Encounter (HOSPITAL_BASED_OUTPATIENT_CLINIC_OR_DEPARTMENT_OTHER): Payer: Self-pay | Admitting: *Deleted

## 2018-11-23 DIAGNOSIS — J3489 Other specified disorders of nose and nasal sinuses: Secondary | ICD-10-CM

## 2018-11-23 HISTORY — DX: Other specified disorders of nose and nasal sinuses: J34.89

## 2018-11-29 ENCOUNTER — Ambulatory Visit (HOSPITAL_BASED_OUTPATIENT_CLINIC_OR_DEPARTMENT_OTHER): Payer: Self-pay | Admitting: Anesthesiology

## 2018-11-29 ENCOUNTER — Encounter (HOSPITAL_BASED_OUTPATIENT_CLINIC_OR_DEPARTMENT_OTHER): Payer: Self-pay

## 2018-11-29 ENCOUNTER — Ambulatory Visit (HOSPITAL_BASED_OUTPATIENT_CLINIC_OR_DEPARTMENT_OTHER)
Admission: RE | Admit: 2018-11-29 | Discharge: 2018-11-29 | Disposition: A | Discharge status: Home / Self Care | Payer: Self-pay | Attending: Orthopedic Surgery | Admitting: Orthopedic Surgery

## 2018-11-29 ENCOUNTER — Other Ambulatory Visit: Payer: Self-pay

## 2018-11-29 ENCOUNTER — Encounter (HOSPITAL_BASED_OUTPATIENT_CLINIC_OR_DEPARTMENT_OTHER)
Admission: RE | Disposition: A | Discharge status: Home / Self Care | Payer: Self-pay | Source: Home / Self Care | Attending: Orthopedic Surgery

## 2018-11-29 DIAGNOSIS — F17200 Nicotine dependence, unspecified, uncomplicated: Secondary | ICD-10-CM | POA: Insufficient documentation

## 2018-11-29 DIAGNOSIS — Z8614 Personal history of Methicillin resistant Staphylococcus aureus infection: Secondary | ICD-10-CM | POA: Insufficient documentation

## 2018-11-29 DIAGNOSIS — L570 Actinic keratosis: Secondary | ICD-10-CM | POA: Insufficient documentation

## 2018-11-29 DIAGNOSIS — M795 Residual foreign body in soft tissue: Secondary | ICD-10-CM | POA: Insufficient documentation

## 2018-11-29 HISTORY — DX: Superficial foreign body of right thumb, initial encounter: S60.351A

## 2018-11-29 HISTORY — DX: Other specified disorders of nose and nasal sinuses: J34.89

## 2018-11-29 HISTORY — DX: Personal history of Methicillin resistant Staphylococcus aureus infection: Z86.14

## 2018-11-29 HISTORY — DX: Migraine, unspecified, not intractable, without status migrainosus: G43.909

## 2018-11-29 HISTORY — PX: FOREIGN BODY REMOVAL: SHX962

## 2018-11-29 SURGERY — REMOVAL FOREIGN BODY EXTREMITY
Anesthesia: General | Laterality: Right

## 2018-11-29 MED ORDER — ONDANSETRON HCL 4 MG/2ML IJ SOLN: 4.0000 mg | Freq: Once | INTRAMUSCULAR | Status: DC | PRN

## 2018-11-29 MED ORDER — FENTANYL CITRATE (PF) 100 MCG/2ML IJ SOLN
INTRAMUSCULAR | Status: AC
  Filled 2018-11-29: qty 2

## 2018-11-29 MED ORDER — EPHEDRINE 5 MG/ML INJ
INTRAVENOUS | Status: AC
  Filled 2018-11-29: qty 10

## 2018-11-29 MED ORDER — LACTATED RINGERS IV SOLN
INTRAVENOUS | Status: DC
  Administered 2018-11-29 (×2): via INTRAVENOUS

## 2018-11-29 MED ORDER — LIDOCAINE HCL (CARDIAC) PF 100 MG/5ML IV SOSY
PREFILLED_SYRINGE | INTRAVENOUS | Status: DC | PRN
  Administered 2018-11-29: 100 mg via INTRAVENOUS

## 2018-11-29 MED ORDER — BUPIVACAINE HCL (PF) 0.25 % IJ SOLN
INTRAMUSCULAR | Status: DC | PRN
  Administered 2018-11-29: 9 mL

## 2018-11-29 MED ORDER — HYDROCODONE-ACETAMINOPHEN 5-325 MG PO TABS: ORAL_TABLET | ORAL | 0 refills | Status: DC

## 2018-11-29 MED ORDER — LIDOCAINE HCL (PF) 1 % IJ SOLN
INTRAMUSCULAR | Status: AC
  Filled 2018-11-29: qty 30

## 2018-11-29 MED ORDER — LIDOCAINE 2% (20 MG/ML) 5 ML SYRINGE
INTRAMUSCULAR | Status: AC
  Filled 2018-11-29: qty 5

## 2018-11-29 MED ORDER — FENTANYL CITRATE (PF) 100 MCG/2ML IJ SOLN
25.0000 ug | INTRAMUSCULAR | Status: DC | PRN
  Administered 2018-11-29: 50 ug via INTRAVENOUS

## 2018-11-29 MED ORDER — PROPOFOL 10 MG/ML IV BOLUS
INTRAVENOUS | Status: DC | PRN
  Administered 2018-11-29: 200 mg via INTRAVENOUS

## 2018-11-29 MED ORDER — MIDAZOLAM HCL 2 MG/2ML IJ SOLN
INTRAMUSCULAR | Status: AC
  Filled 2018-11-29: qty 2

## 2018-11-29 MED ORDER — ONDANSETRON HCL 4 MG/2ML IJ SOLN
INTRAMUSCULAR | Status: DC | PRN
  Administered 2018-11-29: 4 mg via INTRAVENOUS

## 2018-11-29 MED ORDER — OXYCODONE HCL 5 MG PO TABS
ORAL_TABLET | ORAL | Status: AC
  Filled 2018-11-29: qty 1

## 2018-11-29 MED ORDER — EPHEDRINE SULFATE 50 MG/ML IJ SOLN
INTRAMUSCULAR | Status: DC | PRN
  Administered 2018-11-29: 10 mg via INTRAVENOUS

## 2018-11-29 MED ORDER — DEXAMETHASONE SODIUM PHOSPHATE 4 MG/ML IJ SOLN
INTRAMUSCULAR | Status: DC | PRN
  Administered 2018-11-29: 10 mg via INTRAVENOUS

## 2018-11-29 MED ORDER — OXYCODONE HCL 5 MG/5ML PO SOLN: 5.0000 mg | Freq: Once | ORAL | Status: AC | PRN

## 2018-11-29 MED ORDER — DEXAMETHASONE SODIUM PHOSPHATE 10 MG/ML IJ SOLN
INTRAMUSCULAR | Status: AC
  Filled 2018-11-29: qty 1

## 2018-11-29 MED ORDER — BUPIVACAINE HCL (PF) 0.25 % IJ SOLN
INTRAMUSCULAR | Status: AC
  Filled 2018-11-29: qty 30

## 2018-11-29 MED ORDER — CHLORHEXIDINE GLUCONATE 4 % EX LIQD: 60.0000 mL | Freq: Once | CUTANEOUS | Status: DC

## 2018-11-29 MED ORDER — ONDANSETRON HCL 4 MG/2ML IJ SOLN
INTRAMUSCULAR | Status: AC
  Filled 2018-11-29: qty 2

## 2018-11-29 MED ORDER — MIDAZOLAM HCL 2 MG/2ML IJ SOLN
1.0000 mg | INTRAMUSCULAR | Status: DC | PRN
  Administered 2018-11-29: 2 mg via INTRAVENOUS

## 2018-11-29 MED ORDER — CEFAZOLIN SODIUM-DEXTROSE 2-4 GM/100ML-% IV SOLN
INTRAVENOUS | Status: AC
  Filled 2018-11-29: qty 100

## 2018-11-29 MED ORDER — FENTANYL CITRATE (PF) 100 MCG/2ML IJ SOLN
50.0000 ug | INTRAMUSCULAR | Status: AC | PRN
  Administered 2018-11-29: 50 ug via INTRAVENOUS
  Administered 2018-11-29: 25 ug via INTRAVENOUS
  Administered 2018-11-29: 100 ug via INTRAVENOUS
  Administered 2018-11-29: 25 ug via INTRAVENOUS

## 2018-11-29 MED ORDER — CEFAZOLIN SODIUM-DEXTROSE 2-4 GM/100ML-% IV SOLN
2.0000 g | INTRAVENOUS | Status: AC
  Administered 2018-11-29: 2 g via INTRAVENOUS

## 2018-11-29 MED ORDER — OXYCODONE HCL 5 MG PO TABS
5.0000 mg | ORAL_TABLET | Freq: Once | ORAL | Status: AC | PRN
  Administered 2018-11-29: 5 mg via ORAL

## 2018-11-29 MED ORDER — PROPOFOL 10 MG/ML IV BOLUS
INTRAVENOUS | Status: AC
  Filled 2018-11-29: qty 40

## 2018-11-29 MED ORDER — SCOPOLAMINE 1 MG/3DAYS TD PT72: 1.0000 | MEDICATED_PATCH | Freq: Once | TRANSDERMAL | Status: DC | PRN

## 2018-11-29 SURGICAL SUPPLY — 53 items
BANDAGE ACE 3X5.8 VEL STRL LF (GAUZE/BANDAGES/DRESSINGS) IMPLANT
BANDAGE COBAN STERILE 2 (GAUZE/BANDAGES/DRESSINGS) ×2 IMPLANT
BLADE MINI RND TIP GREEN BEAV (BLADE) IMPLANT
BLADE SURG 15 STRL LF DISP TIS (BLADE) ×2 IMPLANT
BLADE SURG 15 STRL SS (BLADE) ×6
BNDG CMPR 9X4 STRL LF SNTH (GAUZE/BANDAGES/DRESSINGS)
BNDG COHESIVE 1X5 TAN STRL LF (GAUZE/BANDAGES/DRESSINGS) IMPLANT
BNDG ELASTIC 2X5.8 VLCR STR LF (GAUZE/BANDAGES/DRESSINGS) IMPLANT
BNDG ESMARK 4X9 LF (GAUZE/BANDAGES/DRESSINGS) IMPLANT
BNDG GAUZE ELAST 4 BULKY (GAUZE/BANDAGES/DRESSINGS) IMPLANT
CHLORAPREP W/TINT 26ML (MISCELLANEOUS) ×3 IMPLANT
CORD BIPOLAR FORCEPS 12FT (ELECTRODE) ×2 IMPLANT
COVER BACK TABLE 60X90IN (DRAPES) ×3 IMPLANT
COVER MAYO STAND STRL (DRAPES) ×3 IMPLANT
COVER WAND RF STERILE (DRAPES) IMPLANT
CUFF TOURNIQUET SINGLE 18IN (TOURNIQUET CUFF) ×3 IMPLANT
DRAPE EXTREMITY T 121X128X90 (DRAPE) ×3 IMPLANT
DRAPE SURG 17X23 STRL (DRAPES) ×3 IMPLANT
GAUZE SPONGE 4X4 12PLY STRL (GAUZE/BANDAGES/DRESSINGS) ×3 IMPLANT
GAUZE XEROFORM 1X8 LF (GAUZE/BANDAGES/DRESSINGS) ×3 IMPLANT
GLOVE BIO SURGEON STRL SZ 6.5 (GLOVE) ×2 IMPLANT
GLOVE BIO SURGEON STRL SZ7.5 (GLOVE) ×3 IMPLANT
GLOVE BIO SURGEONS STRL SZ 6.5 (GLOVE) ×1
GLOVE BIOGEL PI IND STRL 7.0 (GLOVE) IMPLANT
GLOVE BIOGEL PI IND STRL 8 (GLOVE) ×1 IMPLANT
GLOVE BIOGEL PI INDICATOR 7.0 (GLOVE) ×4
GLOVE BIOGEL PI INDICATOR 8 (GLOVE) ×2
GOWN STRL REUS W/ TWL LRG LVL3 (GOWN DISPOSABLE) ×1 IMPLANT
GOWN STRL REUS W/TWL LRG LVL3 (GOWN DISPOSABLE) ×3
GOWN STRL REUS W/TWL XL LVL3 (GOWN DISPOSABLE) ×3 IMPLANT
LOOP VESSEL MAXI BLUE (MISCELLANEOUS) IMPLANT
NDL SAFETY ECLIPSE 18X1.5 (NEEDLE) IMPLANT
NEEDLE HYPO 18GX1.5 SHARP (NEEDLE)
NEEDLE HYPO 25X1 1.5 SAFETY (NEEDLE) ×3 IMPLANT
NS IRRIG 1000ML POUR BTL (IV SOLUTION) ×3 IMPLANT
PACK BASIN DAY SURGERY FS (CUSTOM PROCEDURE TRAY) ×3 IMPLANT
PAD CAST 3X4 CTTN HI CHSV (CAST SUPPLIES) IMPLANT
PADDING CAST ABS 4INX4YD NS (CAST SUPPLIES)
PADDING CAST ABS COTTON 4X4 ST (CAST SUPPLIES) ×1 IMPLANT
PADDING CAST COTTON 3X4 STRL (CAST SUPPLIES)
SPLINT FINGER 3.25 BULB 911905 (SOFTGOODS) ×2 IMPLANT
SPLINT PLASTER CAST XFAST 3X15 (CAST SUPPLIES) IMPLANT
SPLINT PLASTER XTRA FASTSET 3X (CAST SUPPLIES)
STOCKINETTE 4X48 STRL (DRAPES) ×3 IMPLANT
SUT CHROMIC 5 0 P 3 (SUTURE) ×2 IMPLANT
SUT ETHILON 3 0 PS 1 (SUTURE) IMPLANT
SUT ETHILON 4 0 PS 2 18 (SUTURE) ×4 IMPLANT
SWAB COLLECTION DEVICE MRSA (MISCELLANEOUS) IMPLANT
SWAB CULTURE ESWAB REG 1ML (MISCELLANEOUS) IMPLANT
SYR BULB 3OZ (MISCELLANEOUS) ×3 IMPLANT
SYR CONTROL 10ML LL (SYRINGE) IMPLANT
TOWEL GREEN STERILE FF (TOWEL DISPOSABLE) ×6 IMPLANT
UNDERPAD 30X30 (UNDERPADS AND DIAPERS) ×3 IMPLANT

## 2018-11-29 NOTE — Discharge Instructions (Addendum)

## 2018-11-29 NOTE — Op Note (Signed)
NAME: Donald Hurst MEDICAL RECORD NO: [id number] DATE OF BIRTH: Jul 14, 1991 FACILITY: [facility] LOCATION: [facility] PHYSICIAN: Tami Ribas, MD   OPERATIVE REPORT   DATE OF PROCEDURE: 11/29/18    PREOPERATIVE DIAGNOSIS:   Right thumb retained foreign body and mass   POSTOPERATIVE DIAGNOSIS:   Thumb retained foreign body and mass   PROCEDURE:   1.  Right thumb excision of mass measuring 1.5 cm in excised diameter 2.  Right thumb full-thickness skin graft from hyperthenar eminence 1.5 x 1 cm   SURGEON:  Betha Loa, M.D.   ASSISTANT: none   ANESTHESIA:  General   INTRAVENOUS FLUIDS:  Per anesthesia flow sheet.   ESTIMATED BLOOD LOSS:  Minimal.   COMPLICATIONS:  None.   SPECIMENS:   Right thumb mass to pathology   TOURNIQUET TIME:    Total Tourniquet Time Documented: Upper Arm (Right) - 37 minutes Total: Upper Arm (Right) - 37 minutes    DISPOSITION:  Stable to PACU.   INDICATIONS: 27 year old male states he got a piece of glass in his right thumb.  He has had continued wound and a lump in the volar aspect of the thumb at the IP flexion crease.  He wishes to have this removed.  We discussed tensional need for removal of the mass and full-thickness skin grafting. Risks, benefits and alternatives of surgery were discussed including the risks of blood loss, infection, damage to nerves, vessels, tendons, ligaments, bone for surgery, need for additional surgery, complications with wound healing, continued pain, nonunion, malunion, stiffness.  He voiced understanding of these risks and elected to proceed.  OPERATIVE COURSE:  After being identified preoperatively by myself,  the patient and I agreed on the procedure and site of the procedure.  The surgical site was marked.  Surgical consent had been signed. He was given IV Ancef as preoperative antibiotic prophylaxis. He was transferred to the operating room and placed on the operating table in supine position with  the Right upper extremity on an arm board.  General anesthesia was induced by the anesthesiologist.  Right upper extremity was prepped and draped in normal sterile orthopedic fashion.  A surgical pause was performed between the surgeons, anesthesia, and operating room staff and all were in agreement as to the patient, procedure, and site of procedure.  Tourniquet at the proximal aspect of the extremity was inflated to 250 mmHg after exsanguination of the arm with an Esmarch bandage.    Incision was made at the ulnar side of the mass and a Bruner type fashion.  The mass was carefully elevated.  The skin was thickened and the mass was contiguous with the skin.  It was felt that removal of the potential foreign body and mass by itself without removal of the skin would not be possible.  The skin was then ellipsed out.  This was approximate 1 x 1.5 cm in excised diameter.  This was sent to pathology for examination.  The wound was at the flexion crease.  It was felt that this would not granulate well.  A full-thickness skin graft was taken from the hyperthenar eminence.  The wound at the hyperthenar eminence was undermined and then copiously irrigated with sterile saline and closed with 4-0 nylon in a horizontal mattress fashion.  The graft was tacked into the recipient site with 4-0 nylon at the corners.  The edges were sutured with a running 5-0 chromic suture.  This provided good fit into the defect.  A digital  block was performed with quarter percent plain Marcaine and the wound in the hyperthenar eminence injected with the Marcaine as well.  A piece of Xeroform was placed over the graft and the nylon tied over a cotton ball bolster.  The wounds were dressed with sterile 4 x 4's and wrapped with a Coban dressing lightly.  AlumaFoam splints placed on the thumb and wrapped lightly with Coban dressing.  The tourniquet was deflated at 37 minutes.  Fingertips were pink with brisk capillary refill after deflation of  tourniquet.  The operative  drapes were broken down.  The patient was awoken from anesthesia safely.  He was transferred back to the stretcher and taken to PACU in stable condition.  I will see him back in the office in 1 week for postoperative followup.  I will give him a prescription for Norco 5/325 1-2 tabs PO q6 hours prn pain, dispense # 20.   Betha LoaKevin Santana Gosdin, MD Electronically signed, 11/29/18

## 2018-11-29 NOTE — H&P (Signed)
  Donald Hurst is an 27 y.o. male.   Chief Complaint: right thumb foreign body HPI: 27 yo male with foreign body and mass right thumb at IP joint.  This is bothersome to him.  He wishes to have it removed.  Allergies:  Allergies  Allergen Reactions  . Shrimp [Shellfish Allergy] Shortness Of Breath  . Tramadol Nausea Only    Past Medical History:  Diagnosis Date  . Foreign body of right thumb 11/2018   glass  . History of MRSA infection    as a teenager - knee  . Migraines   . Stuffy and runny nose 11/23/2018   green drainage from nose, per pt.    Past Surgical History:  Procedure Laterality Date  . ORIF ANKLE FRACTURE Right   . SHOULDER ARTHROSCOPY W/ LABRAL REPAIR Right   . TONSILLECTOMY AND ADENOIDECTOMY  05/27/2001    Family History: History reviewed. No pertinent family history.  Social History:   reports that he has never smoked. He has never used smokeless tobacco. He reports that he drinks alcohol. He reports that he has current or past drug history.  Medications: No medications prior to admission.    No results found for this or any previous visit (from the past 48 hour(s)).  No results found.   A comprehensive review of systems was negative except for: Constitutional: positive for sweats  Height 6\' 3"  (1.905 m), weight 117.9 kg.  General appearance: alert, cooperative and appears stated age Head: Normocephalic, without obvious abnormality, atraumatic Neck: supple, symmetrical, trachea midline Cardio: regular rate and rhythm Resp: clear to auscultation bilaterally Extremities: Intact sensation and capillary refill all digits.  +epl/fpl/io.  No wounds. Mass at volar aspect IP joint right thumb. Pulses: 2+ and symmetric Skin: Skin color, texture, turgor normal. No rashes or lesions Neurologic: Grossly normal Incision/Wound: none  Assessment/Plan Right thumb foreign body and mass, possibly foreign body granuloma.  Non operative and operative treatment  options have been discussed with the patient and patient wishes to proceed with operative treatment. May require rotation flap or full thickness skin graft for coverage if skin removal required.  Risks, benefits, and alternatives of surgery have been discussed and the patient agrees with the plan of care.   Donald Hurst 11/29/2018, 1:31 PM

## 2018-11-29 NOTE — Transfer of Care (Signed)
Immediate Anesthesia Transfer of Care Note  Patient: Donald FloorDarius J Koppelman  Procedure(s) Performed: RIGHT THUMB REMOVAL FOREIGN BODY MASS (Right )  Patient Location: PACU  Anesthesia Type:General  Level of Consciousness: awake, sedated and patient cooperative  Airway & Oxygen Therapy: Patient Spontanous Breathing and Patient connected to face mask oxygen  Post-op Assessment: Report given to RN and Post -op Vital signs reviewed and stable  Post vital signs: Reviewed and stable  Last Vitals:  Vitals Value Taken Time  BP 129/73 11/29/2018  5:16 PM  Temp    Pulse 73 11/29/2018  5:20 PM  Resp 11 11/29/2018  5:20 PM  SpO2 100 % 11/29/2018  5:20 PM  Vitals shown include unvalidated device data.  Last Pain:  Vitals:   11/29/18 1536  TempSrc: Oral  PainSc: 0-No pain         Complications: No apparent anesthesia complications

## 2018-11-29 NOTE — Anesthesia Procedure Notes (Signed)
Procedure Name: LMA Insertion Date/Time: 11/29/2018 4:21 PM Performed by: Gar GibbonKeeton, Maresha Anastos S, CRNA Pre-anesthesia Checklist: Patient identified, Emergency Drugs available, Suction available and Patient being monitored Patient Re-evaluated:Patient Re-evaluated prior to induction Oxygen Delivery Method: Circle system utilized Preoxygenation: Pre-oxygenation with 100% oxygen Induction Type: IV induction Ventilation: Mask ventilation without difficulty LMA: LMA inserted LMA Size: 5.0 Number of attempts: 1 Airway Equipment and Method: Bite block Placement Confirmation: positive ETCO2 Tube secured with: Tape Dental Injury: Teeth and Oropharynx as per pre-operative assessment

## 2018-11-29 NOTE — Anesthesia Preprocedure Evaluation (Addendum)
Anesthesia Evaluation  Patient identified by MRN, date of birth, ID band Patient awake    Reviewed: Allergy & Precautions, NPO status , Patient's Chart, lab work & pertinent test results  History of Anesthesia Complications Negative for: history of anesthetic complications  Airway Mallampati: II  TM Distance: >3 FB Neck ROM: Full    Dental no notable dental hx.    Pulmonary neg pulmonary ROS, Current Smoker,    Pulmonary exam normal        Cardiovascular negative cardio ROS Normal cardiovascular exam     Neuro/Psych  Headaches, negative psych ROS   GI/Hepatic negative GI ROS, Neg liver ROS,   Endo/Other  negative endocrine ROS  Renal/GU negative Renal ROS  negative genitourinary   Musculoskeletal negative musculoskeletal ROS (+)   Abdominal   Peds  Hematology negative hematology ROS (+)   Anesthesia Other Findings   Reproductive/Obstetrics                            Anesthesia Physical Anesthesia Plan  ASA: II  Anesthesia Plan: General   Post-op Pain Management:    Induction: Intravenous  PONV Risk Score and Plan: 1 and Ondansetron, Dexamethasone, Midazolam and Treatment may vary due to age or medical condition  Airway Management Planned: LMA  Additional Equipment: None  Intra-op Plan:   Post-operative Plan: Extubation in OR  Informed Consent: I have reviewed the patients History and Physical, chart, labs and discussed the procedure including the risks, benefits and alternatives for the proposed anesthesia with the patient or authorized representative who has indicated his/her understanding and acceptance.     Plan Discussed with:   Anesthesia Plan Comments:        Anesthesia Quick Evaluation

## 2018-11-29 NOTE — Anesthesia Postprocedure Evaluation (Signed)
Anesthesia Post Note  Patient: Donald Hurst  Procedure(s) Performed: RIGHT THUMB REMOVAL FOREIGN BODY MASS (Right )     Patient location during evaluation: PACU Anesthesia Type: General Level of consciousness: awake and alert Pain management: pain level controlled Vital Signs Assessment: post-procedure vital signs reviewed and stable Respiratory status: spontaneous breathing, nonlabored ventilation and respiratory function stable Cardiovascular status: blood pressure returned to baseline and stable Postop Assessment: no apparent nausea or vomiting Anesthetic complications: no    Last Vitals:  Vitals:   11/29/18 1716 11/29/18 1717  BP: 129/73   Pulse: 79 77  Resp:  18  Temp:  (!) 36.2 C  SpO2: 100% 100%    Last Pain:  Vitals:   11/29/18 1536  TempSrc: Oral  PainSc: 0-No pain                 Lucretia Kernarolyn E Jaena Brocato

## 2018-11-30 ENCOUNTER — Encounter (HOSPITAL_BASED_OUTPATIENT_CLINIC_OR_DEPARTMENT_OTHER): Payer: Self-pay | Admitting: Orthopedic Surgery

## 2019-11-14 ENCOUNTER — Encounter (HOSPITAL_COMMUNITY): Payer: Self-pay

## 2019-11-14 ENCOUNTER — Emergency Department (HOSPITAL_COMMUNITY): Payer: Self-pay

## 2019-11-14 ENCOUNTER — Other Ambulatory Visit: Payer: Self-pay

## 2019-11-14 ENCOUNTER — Emergency Department (HOSPITAL_COMMUNITY)
Admission: EM | Admit: 2019-11-14 | Discharge: 2019-11-14 | Disposition: A | Discharge status: Home / Self Care | Payer: Self-pay | Attending: Emergency Medicine | Admitting: Emergency Medicine

## 2019-11-14 DIAGNOSIS — M545 Low back pain, unspecified: Secondary | ICD-10-CM

## 2019-11-14 DIAGNOSIS — G8929 Other chronic pain: Secondary | ICD-10-CM | POA: Insufficient documentation

## 2019-11-14 DIAGNOSIS — M62838 Other muscle spasm: Secondary | ICD-10-CM | POA: Insufficient documentation

## 2019-11-14 DIAGNOSIS — R1031 Right lower quadrant pain: Secondary | ICD-10-CM | POA: Insufficient documentation

## 2019-11-14 DIAGNOSIS — M25571 Pain in right ankle and joints of right foot: Secondary | ICD-10-CM

## 2019-11-14 DIAGNOSIS — R05 Cough: Secondary | ICD-10-CM | POA: Insufficient documentation

## 2019-11-14 DIAGNOSIS — S8291XD Unspecified fracture of right lower leg, subsequent encounter for closed fracture with routine healing: Secondary | ICD-10-CM | POA: Insufficient documentation

## 2019-11-14 DIAGNOSIS — K4090 Unilateral inguinal hernia, without obstruction or gangrene, not specified as recurrent: Secondary | ICD-10-CM | POA: Insufficient documentation

## 2019-11-14 DIAGNOSIS — F17210 Nicotine dependence, cigarettes, uncomplicated: Secondary | ICD-10-CM | POA: Insufficient documentation

## 2019-11-14 LAB — I-STAT CHEM 8, ED
BUN: 19 mg/dL (ref 6–20)
Calcium, Ion: 1.22 mmol/L (ref 1.15–1.40)
Chloride: 101 mmol/L (ref 98–111)
Creatinine, Ser: 1.3 mg/dL — ABNORMAL HIGH (ref 0.61–1.24)
Glucose, Bld: 84 mg/dL (ref 70–99)
HCT: 44 % (ref 39.0–52.0)
Hemoglobin: 15 g/dL (ref 13.0–17.0)
Potassium: 4.3 mmol/L (ref 3.5–5.1)
Sodium: 138 mmol/L (ref 135–145)
TCO2: 27 mmol/L (ref 22–32)

## 2019-11-14 MED ORDER — FENTANYL CITRATE (PF) 100 MCG/2ML IJ SOLN
50.0000 ug | Freq: Once | INTRAMUSCULAR | Status: AC
  Administered 2019-11-14: 50 ug via INTRAVENOUS
  Filled 2019-11-14: qty 2

## 2019-11-14 MED ORDER — MORPHINE SULFATE (PF) 4 MG/ML IV SOLN
4.0000 mg | Freq: Once | INTRAVENOUS | Status: AC
  Administered 2019-11-14: 18:00:00 4 mg via INTRAVENOUS
  Filled 2019-11-14: qty 1

## 2019-11-14 MED ORDER — MELOXICAM 15 MG PO TABS: 15.0000 mg | ORAL_TABLET | Freq: Every day | ORAL | 0 refills | Status: DC

## 2019-11-14 MED ORDER — IOHEXOL 300 MG/ML  SOLN
100.0000 mL | Freq: Once | INTRAMUSCULAR | Status: AC | PRN
  Administered 2019-11-14: 100 mL via INTRAVENOUS

## 2019-11-14 MED ORDER — ACETAMINOPHEN 325 MG PO TABS
650.0000 mg | ORAL_TABLET | Freq: Once | ORAL | Status: AC
  Administered 2019-11-14: 20:00:00 650 mg via ORAL
  Filled 2019-11-14: qty 2

## 2019-11-14 MED ORDER — SODIUM CHLORIDE (PF) 0.9 % IJ SOLN
INTRAMUSCULAR | Status: AC
  Filled 2019-11-14: qty 50

## 2019-11-14 MED ORDER — ONDANSETRON HCL 4 MG/2ML IJ SOLN
4.0000 mg | Freq: Once | INTRAMUSCULAR | Status: AC
  Administered 2019-11-14: 17:00:00 4 mg via INTRAVENOUS
  Filled 2019-11-14: qty 2

## 2019-11-14 NOTE — Discharge Instructions (Addendum)
Your CT scan was negative for any abnormality including hernia.  Your ankle also shows no acute abnormalities. You will need to follow up with your surgeon about your ankle. I am discharging you with antiinflammatory pain medication. Pleas take this medication with food. Wear an ankle brace when you are on your feet for long periods of time. Return to the ER for any new or worsening symptoms.
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
## 2019-11-14 NOTE — ED Triage Notes (Addendum)
Pt presents via EMS with c/o right lower back pain, right groin pain, and right ankle pain. Pt reports the groin pain and back pain started approx one week ago. Pt also reporting right ankle pain that is old in nature but EMS reports that the ankle is swollen.

## 2019-11-15 LAB — I-STAT BETA HCG BLOOD, ED (MC, WL, AP ONLY): I-stat hCG, quantitative: <5 m[IU]/mL (ref <5)

## 2020-08-03 ENCOUNTER — Encounter (HOSPITAL_COMMUNITY): Payer: Self-pay | Admitting: Emergency Medicine

## 2020-08-03 ENCOUNTER — Emergency Department (HOSPITAL_COMMUNITY): Payer: Self-pay

## 2020-08-03 ENCOUNTER — Emergency Department (HOSPITAL_COMMUNITY)
Admission: EM | Admit: 2020-08-03 | Discharge: 2020-08-04 | Disposition: A | Discharge status: Home / Self Care | Payer: Self-pay | Attending: Emergency Medicine | Admitting: Emergency Medicine

## 2020-08-03 DIAGNOSIS — Y9301 Activity, walking, marching and hiking: Secondary | ICD-10-CM | POA: Insufficient documentation

## 2020-08-03 DIAGNOSIS — S91115A Laceration without foreign body of left lesser toe(s) without damage to nail, initial encounter: Secondary | ICD-10-CM | POA: Insufficient documentation

## 2020-08-03 DIAGNOSIS — S91112A Laceration without foreign body of left great toe without damage to nail, initial encounter: Secondary | ICD-10-CM | POA: Insufficient documentation

## 2020-08-03 DIAGNOSIS — Y999 Unspecified external cause status: Secondary | ICD-10-CM | POA: Insufficient documentation

## 2020-08-03 DIAGNOSIS — S0102XA Laceration with foreign body of scalp, initial encounter: Secondary | ICD-10-CM | POA: Insufficient documentation

## 2020-08-03 DIAGNOSIS — Y92410 Unspecified street and highway as the place of occurrence of the external cause: Secondary | ICD-10-CM | POA: Insufficient documentation

## 2020-08-03 DIAGNOSIS — T1490XA Injury, unspecified, initial encounter: Secondary | ICD-10-CM

## 2020-08-03 DIAGNOSIS — S41112A Laceration without foreign body of left upper arm, initial encounter: Secondary | ICD-10-CM | POA: Insufficient documentation

## 2020-08-03 DIAGNOSIS — Z23 Encounter for immunization: Secondary | ICD-10-CM | POA: Insufficient documentation

## 2020-08-03 DIAGNOSIS — Z20822 Contact with and (suspected) exposure to covid-19: Secondary | ICD-10-CM | POA: Insufficient documentation

## 2020-08-03 DIAGNOSIS — T07XXXA Unspecified multiple injuries, initial encounter: Secondary | ICD-10-CM

## 2020-08-03 DIAGNOSIS — S41111A Laceration without foreign body of right upper arm, initial encounter: Secondary | ICD-10-CM | POA: Insufficient documentation

## 2020-08-03 DIAGNOSIS — S01111A Laceration without foreign body of right eyelid and periocular area, initial encounter: Secondary | ICD-10-CM | POA: Insufficient documentation

## 2020-08-03 LAB — COMPREHENSIVE METABOLIC PANEL
ALT: 21 U/L (ref 0–44)
AST: 52 U/L — ABNORMAL HIGH (ref 15–41)
Albumin: 3.6 g/dL (ref 3.5–5.0)
Alkaline Phosphatase: 54 U/L (ref 38–126)
Anion gap: 11 (ref 5–15)
BUN: 13 mg/dL (ref 6–20)
CO2: 22 mmol/L (ref 22–32)
Calcium: 9.3 mg/dL (ref 8.9–10.3)
Chloride: 106 mmol/L (ref 98–111)
Creatinine, Ser: 1.38 mg/dL — ABNORMAL HIGH (ref 0.61–1.24)
GFR calc Af Amer: >60 mL/min (ref >60)
GFR calc non Af Amer: >60 mL/min (ref >60)
Glucose, Bld: 126 mg/dL — ABNORMAL HIGH (ref 70–99)
Potassium: 4 mmol/L (ref 3.5–5.1)
Sodium: 139 mmol/L (ref 135–145)
Total Bilirubin: 0.7 mg/dL (ref 0.3–1.2)
Total Protein: 6.7 g/dL (ref 6.5–8.1)

## 2020-08-03 LAB — PROTIME-INR
INR: 1 (ref 0.8–1.2)
Prothrombin Time: 12.6 seconds (ref 11.4–15.2)

## 2020-08-03 LAB — ETHANOL: Alcohol, Ethyl (B): <10 mg/dL (ref <10)

## 2020-08-03 LAB — I-STAT CHEM 8, ED
BUN: 13 mg/dL (ref 6–20)
Calcium, Ion: 1.06 mmol/L — ABNORMAL LOW (ref 1.15–1.40)
Chloride: 105 mmol/L (ref 98–111)
Creatinine, Ser: 1.2 mg/dL (ref 0.61–1.24)
Glucose, Bld: 122 mg/dL — ABNORMAL HIGH (ref 70–99)
HCT: 39 % (ref 39.0–52.0)
Hemoglobin: 13.3 g/dL (ref 13.0–17.0)
Potassium: 4 mmol/L (ref 3.5–5.1)
Sodium: 139 mmol/L (ref 135–145)
TCO2: 22 mmol/L (ref 22–32)

## 2020-08-03 LAB — TYPE AND SCREEN
ABO/RH(D): AB POS
Antibody Screen: NEGATIVE
Unit division: 0

## 2020-08-03 LAB — CBC
HCT: 39.5 % (ref 39.0–52.0)
Hemoglobin: 12.8 g/dL — ABNORMAL LOW (ref 13.0–17.0)
MCH: 31.1 pg (ref 26.0–34.0)
MCHC: 32.4 g/dL (ref 30.0–36.0)
MCV: 95.9 fL (ref 80.0–100.0)
Platelets: 336 10*3/uL (ref 150–400)
RBC: 4.12 MIL/uL — ABNORMAL LOW (ref 4.22–5.81)
RDW: 12.8 % (ref 11.5–15.5)
WBC: 9 10*3/uL (ref 4.0–10.5)
nRBC: 0 % (ref 0.0–0.2)

## 2020-08-03 LAB — BPAM RBC
Blood Product Expiration Date: 202109042359
ISSUE DATE / TIME: 202108131636
Unit Type and Rh: 5100

## 2020-08-03 LAB — LACTIC ACID, PLASMA: Lactic Acid, Venous: 1.3 mmol/L (ref 0.5–1.9)

## 2020-08-03 LAB — SARS CORONAVIRUS 2 BY RT PCR (HOSPITAL ORDER, PERFORMED IN ~~LOC~~ HOSPITAL LAB): SARS Coronavirus 2: NEGATIVE

## 2020-08-03 LAB — ABO/RH: ABO/RH(D): AB POS

## 2020-08-03 MED ORDER — MIDAZOLAM HCL 2 MG/2ML IJ SOLN
1.0000 mg | Freq: Once | INTRAMUSCULAR | Status: AC
  Administered 2020-08-03: 1 mg via INTRAVENOUS
  Filled 2020-08-03: qty 2

## 2020-08-03 MED ORDER — MIDAZOLAM HCL 2 MG/2ML IJ SOLN
2.0000 mg | Freq: Once | INTRAMUSCULAR | Status: AC
  Administered 2020-08-03: 2 mg via INTRAVENOUS

## 2020-08-03 MED ORDER — CEPHALEXIN 500 MG PO CAPS: 500.0000 mg | ORAL_CAPSULE | Freq: Four times a day (QID) | ORAL | 0 refills | Status: DC

## 2020-08-03 MED ORDER — TETANUS-DIPHTH-ACELL PERTUSSIS 5-2.5-18.5 LF-MCG/0.5 IM SUSP
0.5000 mL | Freq: Once | INTRAMUSCULAR | Status: AC
  Administered 2020-08-03: 0.5 mL via INTRAMUSCULAR

## 2020-08-03 MED ORDER — HALOPERIDOL LACTATE 5 MG/ML IJ SOLN
5.0000 mg | Freq: Once | INTRAMUSCULAR | Status: AC
  Administered 2020-08-03: 5 mg via INTRAVENOUS

## 2020-08-03 MED ORDER — CEFAZOLIN SODIUM-DEXTROSE 2-4 GM/100ML-% IV SOLN
2.0000 g | Freq: Once | INTRAVENOUS | Status: AC
  Administered 2020-08-03: 2 g via INTRAVENOUS
  Filled 2020-08-03: qty 100

## 2020-08-03 MED ORDER — CEFAZOLIN SODIUM-DEXTROSE 2-4 GM/100ML-% IV SOLN: 2.0000 g | Freq: Once | INTRAVENOUS | Status: DC

## 2020-08-03 MED ORDER — MIDAZOLAM HCL 2 MG/2ML IJ SOLN
INTRAMUSCULAR | Status: AC
  Filled 2020-08-03: qty 2

## 2020-08-03 MED ORDER — SODIUM CHLORIDE 0.9 % IV SOLN
INTRAVENOUS | Status: AC | PRN
  Administered 2020-08-03: 1000 mL via INTRAVENOUS

## 2020-08-03 MED ORDER — LIDOCAINE-EPINEPHRINE 1 %-1:100000 IJ SOLN
60.0000 mL | Freq: Once | INTRAMUSCULAR | Status: AC
  Administered 2020-08-03: 60 mL via INTRADERMAL
  Filled 2020-08-03: qty 3

## 2020-08-03 MED ORDER — IOHEXOL 300 MG/ML  SOLN
100.0000 mL | Freq: Once | INTRAMUSCULAR | Status: AC | PRN
  Administered 2020-08-03: 100 mL via INTRAVENOUS

## 2020-08-03 MED ORDER — MIDAZOLAM HCL 2 MG/2ML IJ SOLN
1.0000 mg | Freq: Once | INTRAMUSCULAR | Status: AC
  Administered 2020-08-03: 1 mg via INTRAVENOUS

## 2020-08-03 NOTE — ED Notes (Signed)
Pt now sitting up in bed drinking ginger ale, pt was also provided a [place] sandwich.
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
## 2020-08-03 NOTE — ED Notes (Signed)
To ct with TRN
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
## 2020-08-03 NOTE — ED Provider Notes (Signed)
..Laceration Repair  Date/Time: 08/03/2020 7:54 PM Performed by: Arthor Captain, PA-C Authorized by: Arthor Captain, PA-C   Consent:    Consent obtained:  Emergent situation Universal protocol:    Imaging studies available: yes     Site/side marked: yes     Patient identity confirmed:  Arm band Anesthesia (see MAR for exact dosages):    Anesthesia method:  Local infiltration   Local anesthetic:  Lidocaine 1% WITH epi Laceration details:    Location:  Scalp   Scalp location:  Frontal   Length (cm):  20   Depth (mm):  30 Repair type:    Repair type:  Intermediate Pre-procedure details:    Preparation:  Patient was prepped and draped in usual sterile fashion Exploration:    Wound exploration: wound explored through full range of motion     Wound extent: fascia violated     Wound extent comment:  Donnetta Hutching tear Treatment:    Area cleansed with:  Shur-Clens   Amount of cleaning:  Extensive Subcutaneous repair:    Suture size:  4-0   Suture material:  Vicryl   Suture technique:  Simple interrupted   Number of sutures:  1 Skin repair:    Repair method:  Staples   Number of staples:  7 Approximation:    Approximation:  Close .Marland KitchenLaceration Repair  Date/Time: 08/03/2020 7:57 PM Performed by: Arthor Captain, PA-C Authorized by: Arthor Captain, PA-C   Consent:    Consent obtained:  Emergent situation Universal protocol:    Imaging studies available: yes     Site/side marked: yes     Patient identity confirmed:  Arm band Anesthesia (see MAR for exact dosages):    Anesthesia method:  Local infiltration   Local anesthetic:  Lidocaine 1% WITH epi Laceration details:    Location:  Face   Face location:  R eyebrow   Length (cm):  2 Repair type:    Repair type:  Simple Exploration:    Wound exploration: wound explored through full range of motion   Treatment:    Area cleansed with:  Shur-Clens and Betadine   Amount of cleaning:  Standard   Irrigation solution:  Sterile  saline Skin repair:    Repair method:  Sutures   Suture size:  3-0   Suture material:  Plain gut   Suture technique:  Running locked   Number of sutures:  3 Approximation:    Approximation:  Close Post-procedure details:    Dressing:  Open (no dressing) .Marland KitchenLaceration Repair  Date/Time: 08/03/2020 7:59 PM Performed by: Arthor Captain, PA-C Authorized by: Arthor Captain, PA-C   Consent:    Consent obtained:  Emergent situation Universal protocol:    Imaging studies available: yes     Site/side marked: yes     Immediately prior to procedure, a time out was called: yes     Patient identity confirmed:  Arm band Anesthesia (see MAR for exact dosages):    Anesthesia method:  Local infiltration   Local anesthetic:  Lidocaine 1% WITH epi Laceration details:    Location:  Shoulder/arm   Shoulder/arm location:  L lower arm   Length (cm):  6 Repair type:    Repair type:  Simple Exploration:    Wound exploration: wound explored through full range of motion     Contaminated: yes   Treatment:    Area cleansed with:  Shur-Clens and Betadine   Amount of cleaning:  Extensive   Visualized foreign bodies/material removed: yes   Skin repair:  Repair method:  Staples   Number of staples:  5 Approximation:    Approximation:  Close Post-procedure details:    Dressing:  Open (no dressing) .Marland Kitchen.Laceration Repair  Date/Time: 08/03/2020 9:42 PM Performed by: Arthor CaptainHarris, Ayinde Swim, PA-C Authorized by: Arthor CaptainHarris, Merit Maybee, PA-C   Consent:    Consent obtained:  Emergent situation Anesthesia (see MAR for exact dosages):    Anesthesia method:  Local infiltration   Local anesthetic:  Lidocaine 1% WITH epi Laceration details:    Location:  Shoulder/arm   Shoulder/arm location:  L lower arm   Length (cm):  4 Repair type:    Repair type:  Simple Pre-procedure details:    Preparation:  Patient was prepped and draped in usual sterile fashion Exploration:    Wound exploration: wound explored through  full range of motion     Contaminated: yes   Treatment:    Area cleansed with:  Shur-Clens and Betadine   Amount of cleaning:  Extensive   Irrigation solution:  Sterile saline   Visualized foreign bodies/material removed: yes   Skin repair:    Repair method:  Staples   Number of staples:  3 Approximation:    Approximation:  Close Post-procedure details:    Dressing:  Open (no dressing) .Marland Kitchen.Laceration Repair  Date/Time: 08/03/2020 9:46 PM Performed by: Arthor CaptainHarris, Vincen Bejar, PA-C Authorized by: Arthor CaptainHarris, Valli Randol, PA-C   Consent:    Consent obtained:  Emergent situation Universal protocol:    Test results available and properly labeled: yes     Imaging studies available: yes     Patient identity confirmed:  Arm band Anesthesia (see MAR for exact dosages):    Anesthesia method:  Local infiltration Laceration details:    Location:  Shoulder/arm   Shoulder/arm location:  L lower arm   Length (cm):  6 Repair type:    Repair type:  Intermediate Exploration:    Wound exploration: wound explored through full range of motion     Contaminated: yes   Treatment:    Area cleansed with:  Betadine and Shur-Clens   Amount of cleaning:  Extensive   Irrigation solution:  Sterile saline Skin repair:    Repair method:  Staples   Number of staples:  3 Approximation:    Approximation:  Close Post-procedure details:    Dressing:  Open (no dressing) .Marland Kitchen.Laceration Repair  Date/Time: 08/03/2020 11:09 PM Performed by: Arthor CaptainHarris, Brighton Delio, PA-C Authorized by: Arthor CaptainHarris, Ambri Miltner, PA-C   Consent:    Consent obtained:  Emergent situation Universal protocol:    Patient identity confirmed:  Arm band Anesthesia (see MAR for exact dosages):    Anesthesia method:  Local infiltration   Local anesthetic:  Lidocaine 1% WITH epi Laceration details:    Location:  Shoulder/arm   Shoulder/arm location:  R lower arm   Length (cm):  7 Repair type:    Repair type:  Simple Exploration:    Wound exploration: wound  explored through full range of motion     Contaminated: yes   Treatment:    Area cleansed with:  Betadine and Shur-Clens   Amount of cleaning:  Extensive   Irrigation solution:  Sterile saline   Irrigation method:  Pressure wash   Visualized foreign bodies/material removed: yes   Skin repair:    Repair method:  Staples   Number of staples:  5 Approximation:    Approximation:  Close Post-procedure details:    Dressing:  Open (no dressing)   Patient tolerance of procedure:  Tolerated well, no immediate complications .Marland Kitchen.Laceration Repair  Date/Time: 08/03/2020 11:10 PM Performed by: Arthor Captain, PA-C Authorized by: Arthor Captain, PA-C   Universal protocol:    Patient identity confirmed:  Arm band Anesthesia (see MAR for exact dosages):    Anesthesia method:  Local infiltration   Local anesthetic:  Lidocaine 1% WITH epi Laceration details:    Location:  Toe   Toe location:  L second toe   Length (cm):  2 Repair type:    Repair type:  Simple Pre-procedure details:    Preparation:  Patient was prepped and draped in usual sterile fashion Exploration:    Wound exploration: wound explored through full range of motion and entire depth of wound probed and visualized   Treatment:    Area cleansed with:  Betadine and Shur-Clens   Amount of cleaning:  Standard   Irrigation solution:  Sterile saline Skin repair:    Repair method:  Sutures   Suture size:  4-0   Suture material:  Plain gut   Suture technique:  Running locked   Number of sutures:  5 Approximation:    Approximation:  Close Post-procedure details:    Dressing:  Open (no dressing) .Marland KitchenLaceration Repair  Date/Time: 08/03/2020 11:12 PM Performed by: Arthor Captain, PA-C Authorized by: Arthor Captain, PA-C   Consent:    Consent obtained:  Emergent situation Universal protocol:    Patient identity confirmed:  Arm band Anesthesia (see MAR for exact dosages):    Anesthesia method:  Local  infiltration .Marland KitchenLaceration Repair  Date/Time: 08/03/2020 11:14 PM Performed by: Arthor Captain, PA-C Authorized by: Arthor Captain, PA-C   Consent:    Consent obtained:  Emergent situation Universal protocol:    Patient identity confirmed:  Arm band Anesthesia (see MAR for exact dosages):    Anesthesia method:  Local infiltration   Local anesthetic:  Lidocaine 1% WITH epi Laceration details:    Location:  Toe   Toe location:  L big toe   Length (cm):  1 Repair type:    Repair type:  Simple Pre-procedure details:    Preparation:  Patient was prepped and draped in usual sterile fashion Exploration:    Wound exploration: wound explored through full range of motion and entire depth of wound probed and visualized     Contaminated: no   Treatment:    Area cleansed with:  Betadine and Shur-Clens   Amount of cleaning:  Standard   Irrigation solution:  Sterile saline   Irrigation method:  Pressure wash Skin repair:    Repair method:  Sutures   Suture size:  4-0   Suture material:  Plain gut   Suture technique:  Simple interrupted   Number of sutures:  3 Approximation:    Approximation:  Close Post-procedure details:    Dressing:  Open (no dressing) .Marland KitchenLaceration Repair  Date/Time: 08/03/2020 11:16 PM Performed by: Arthor Captain, PA-C Authorized by: Arthor Captain, PA-C   Consent:    Consent obtained:  Emergent situation Universal protocol:    Patient identity confirmed:  Arm band Anesthesia (see MAR for exact dosages):    Anesthesia method:  Local infiltration   Local anesthetic:  Lidocaine 1% WITH epi Laceration details:    Location:  Shoulder/arm   Shoulder/arm location:  R lower arm   Length (cm):  14   Depth (mm):  20 Repair type:    Repair type:  Intermediate Pre-procedure details:    Preparation:  Patient was prepped and draped in usual sterile fashion Exploration:    Wound exploration: wound explored through full  range of motion and entire depth of wound  probed and visualized     Wound extent comment:  Exposed Ulna   Contaminated: no   Treatment:    Area cleansed with:  Shur-Clens and Betadine   Amount of cleaning:  Extensive   Irrigation solution:  Sterile saline   Irrigation volume:  1 Liter   Irrigation method:  Pressure wash Skin repair:    Repair method:  Sutures   Suture size:  4-0   Suture material:  Plain gut   Suture technique:  Running locked and simple interrupted   Number of sutures:  20 Approximation:    Approximation:  Loose Post-procedure details:    Dressing:  Open (no dressing)      Arthor Captain, PA-C 08/03/20 2330    Cathren Laine, MD 08/08/20 1434

## 2020-08-03 NOTE — ED Notes (Signed)
Pt dressed in paper scrubs and all wounds bandaged. Pt was given [place] sandwich and ginger ale however is not able to eat at this time due to returning to sleep from medication. RN notified.

## 2020-08-03 NOTE — Progress Notes (Signed)
Chaplain followed up referral from daytime chaplain. No family present as patient status is confidential. Lunette Stands will be available if needed. Rev. Lynnell Chad  Pager [phone].
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
## 2020-08-03 NOTE — Discharge Instructions (Addendum)
It was our pleasure to provide your ER care today - we hope that you feel better.  Keep wounds very clean. Wash with soap and water 1-2x/day. Change dressing 1-2x/day. Have sutures/staples removed, your doctor or urgent care, in 10-12 days time.   Follow up with primary care doctor, or urgent care, in 2 days time for recheck/wound check.   Take antibiotic as prescribed.   Take acetaminophen or ibuprofen as need.   Return to ER if worse, new symptoms, new or severe pain, infection of wounds (pus, spreading redness, increased swelling, or severe pain), or other concern.
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
## 2020-08-06 ENCOUNTER — Encounter (HOSPITAL_COMMUNITY): Payer: Self-pay

## 2020-08-11 ENCOUNTER — Emergency Department (HOSPITAL_COMMUNITY)
Admission: EM | Admit: 2020-08-11 | Discharge: 2020-08-11 | Disposition: A | Discharge status: Home / Self Care | Payer: Self-pay | Attending: Emergency Medicine | Admitting: Emergency Medicine

## 2020-08-11 ENCOUNTER — Other Ambulatory Visit: Payer: Self-pay

## 2020-08-11 ENCOUNTER — Encounter (HOSPITAL_COMMUNITY): Payer: Self-pay | Admitting: Emergency Medicine

## 2020-08-11 DIAGNOSIS — Z4801 Encounter for change or removal of surgical wound dressing: Secondary | ICD-10-CM | POA: Insufficient documentation

## 2020-08-11 DIAGNOSIS — S41112D Laceration without foreign body of left upper arm, subsequent encounter: Secondary | ICD-10-CM | POA: Insufficient documentation

## 2020-08-11 DIAGNOSIS — S41111D Laceration without foreign body of right upper arm, subsequent encounter: Secondary | ICD-10-CM | POA: Insufficient documentation

## 2020-08-11 DIAGNOSIS — Z4802 Encounter for removal of sutures: Secondary | ICD-10-CM | POA: Insufficient documentation

## 2020-08-11 DIAGNOSIS — S4992XD Unspecified injury of left shoulder and upper arm, subsequent encounter: Secondary | ICD-10-CM | POA: Insufficient documentation

## 2020-08-11 MED ORDER — CLINDAMYCIN HCL 150 MG PO CAPS: 150.0000 mg | ORAL_CAPSULE | Freq: Three times a day (TID) | ORAL | 0 refills | Status: AC

## 2020-08-11 MED ORDER — ACETAMINOPHEN 325 MG PO TABS
650.0000 mg | ORAL_TABLET | Freq: Once | ORAL | Status: AC | PRN
  Administered 2020-08-11: 650 mg via ORAL
  Filled 2020-08-11: qty 2

## 2020-08-11 MED ORDER — IBUPROFEN 400 MG PO TABS
600.0000 mg | ORAL_TABLET | Freq: Once | ORAL | Status: AC
  Administered 2020-08-11: 600 mg via ORAL
  Filled 2020-08-11: qty 1

## 2020-08-11 MED ORDER — ACETAMINOPHEN 500 MG PO TABS
1000.0000 mg | ORAL_TABLET | Freq: Once | ORAL | Status: AC
  Administered 2020-08-11: 1000 mg via ORAL
  Filled 2020-08-11: qty 2
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
## 2020-08-11 NOTE — ED Notes (Signed)
Patient discharge instructions reviewed with pt caregiver. Discussed s/sx to return, PCP follow up, medications given/next dose due, and prescriptions. Caregiver verbalized understanding.   Extensive wound care completed to wounds on bilateral arms, head, and LLE, L fingers. Purulent drainage noted from R forearm wound, and edema noted to L forearm wound. Cleansed and redressed. Instructed to return to ED in 3 days for further care. Pt verbalized understanding

## 2020-08-11 NOTE — Discharge Instructions (Addendum)
Take antibiotics as prescribed.  Keep wounds clean. Return for reassessment in 3 days. Use Tylenol and ibuprofen as needed for pain.
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
## 2020-08-16 ENCOUNTER — Emergency Department (HOSPITAL_COMMUNITY)
Admission: EM | Admit: 2020-08-16 | Discharge: 2020-08-16 | Disposition: A | Discharge status: Home / Self Care | Payer: Self-pay | Attending: Emergency Medicine | Admitting: Emergency Medicine

## 2020-08-16 ENCOUNTER — Emergency Department (HOSPITAL_COMMUNITY): Payer: Self-pay

## 2020-08-16 ENCOUNTER — Other Ambulatory Visit: Payer: Self-pay

## 2020-08-16 ENCOUNTER — Encounter (HOSPITAL_COMMUNITY): Payer: Self-pay

## 2020-08-16 DIAGNOSIS — R079 Chest pain, unspecified: Secondary | ICD-10-CM | POA: Insufficient documentation

## 2020-08-16 DIAGNOSIS — R1110 Vomiting, unspecified: Secondary | ICD-10-CM | POA: Insufficient documentation

## 2020-08-16 DIAGNOSIS — Z5321 Procedure and treatment not carried out due to patient leaving prior to being seen by health care provider: Secondary | ICD-10-CM | POA: Insufficient documentation

## 2020-08-16 DIAGNOSIS — R519 Headache, unspecified: Secondary | ICD-10-CM | POA: Insufficient documentation

## 2020-08-16 LAB — BASIC METABOLIC PANEL
Anion gap: 13 (ref 5–15)
BUN: 12 mg/dL (ref 6–20)
CO2: 27 mmol/L (ref 22–32)
Calcium: 10.1 mg/dL (ref 8.9–10.3)
Chloride: 98 mmol/L (ref 98–111)
Creatinine, Ser: 1.05 mg/dL (ref 0.61–1.24)
GFR calc Af Amer: >60 mL/min (ref >60)
GFR calc non Af Amer: >60 mL/min (ref >60)
Glucose, Bld: 105 mg/dL — ABNORMAL HIGH (ref 70–99)
Potassium: 4 mmol/L (ref 3.5–5.1)
Sodium: 138 mmol/L (ref 135–145)

## 2020-08-16 LAB — CBC
HCT: 33.5 % — ABNORMAL LOW (ref 39.0–52.0)
Hemoglobin: 10.9 g/dL — ABNORMAL LOW (ref 13.0–17.0)
MCH: 32.8 pg (ref 26.0–34.0)
MCHC: 32.5 g/dL (ref 30.0–36.0)
MCV: 100.9 fL — ABNORMAL HIGH (ref 80.0–100.0)
Platelets: 512 10*3/uL — ABNORMAL HIGH (ref 150–400)
RBC: 3.32 MIL/uL — ABNORMAL LOW (ref 4.22–5.81)
RDW: 15 % (ref 11.5–15.5)
WBC: 14 10*3/uL — ABNORMAL HIGH (ref 4.0–10.5)
nRBC: 0 % (ref 0.0–0.2)

## 2020-08-16 LAB — TROPONIN I (HIGH SENSITIVITY): Troponin I (High Sensitivity): 6 ng/L (ref <18)
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
## 2020-08-16 NOTE — ED Notes (Signed)
Unable to locate patient. Called multiple times.

## 2020-08-16 NOTE — ED Notes (Signed)
Pt goes in and out of waiting room and will wait until he comes back in to update his vitals

## 2020-08-18 ENCOUNTER — Encounter (HOSPITAL_COMMUNITY): Payer: Self-pay | Admitting: Emergency Medicine

## 2020-08-18 ENCOUNTER — Other Ambulatory Visit: Payer: Self-pay

## 2020-08-18 ENCOUNTER — Emergency Department (HOSPITAL_COMMUNITY)
Admission: EM | Admit: 2020-08-18 | Discharge: 2020-08-18 | Disposition: A | Discharge status: Home / Self Care | Payer: Self-pay | Attending: Emergency Medicine | Admitting: Emergency Medicine

## 2020-08-18 DIAGNOSIS — M791 Myalgia, unspecified site: Secondary | ICD-10-CM | POA: Insufficient documentation

## 2020-08-18 DIAGNOSIS — Y999 Unspecified external cause status: Secondary | ICD-10-CM | POA: Insufficient documentation

## 2020-08-18 DIAGNOSIS — Y9289 Other specified places as the place of occurrence of the external cause: Secondary | ICD-10-CM | POA: Insufficient documentation

## 2020-08-18 DIAGNOSIS — F17210 Nicotine dependence, cigarettes, uncomplicated: Secondary | ICD-10-CM | POA: Insufficient documentation

## 2020-08-18 DIAGNOSIS — Y939 Activity, unspecified: Secondary | ICD-10-CM | POA: Insufficient documentation

## 2020-08-18 MED ORDER — BACITRACIN ZINC 500 UNIT/GM EX OINT
TOPICAL_OINTMENT | Freq: Once | CUTANEOUS | Status: AC
  Administered 2020-08-18: 1 via TOPICAL
  Filled 2020-08-18: qty 0.9

## 2020-08-18 MED ORDER — SULFAMETHOXAZOLE-TRIMETHOPRIM 800-160 MG PO TABS: 1.0000 | ORAL_TABLET | Freq: Two times a day (BID) | ORAL | 0 refills | Status: AC

## 2020-08-18 MED ORDER — HYDROCODONE-ACETAMINOPHEN 5-325 MG PO TABS: 1.0000 | ORAL_TABLET | Freq: Four times a day (QID) | ORAL | 0 refills | Status: AC | PRN

## 2020-08-18 MED ORDER — HYDROCODONE-ACETAMINOPHEN 5-325 MG PO TABS
1.0000 | ORAL_TABLET | Freq: Once | ORAL | Status: AC
  Administered 2020-08-18: 1 via ORAL
  Filled 2020-08-18: qty 1

## 2020-08-18 MED ORDER — CEPHALEXIN 500 MG PO CAPS: 500.0000 mg | ORAL_CAPSULE | Freq: Once | ORAL | Status: DC

## 2020-08-18 MED ORDER — MELOXICAM 7.5 MG PO TABS: 7.5000 mg | ORAL_TABLET | Freq: Every day | ORAL | 0 refills | Status: AC

## 2020-08-18 MED ORDER — SULFAMETHOXAZOLE-TRIMETHOPRIM 800-160 MG PO TABS
1.0000 | ORAL_TABLET | Freq: Once | ORAL | Status: AC
  Administered 2020-08-18: 1 via ORAL
  Filled 2020-08-18: qty 1
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
## 2020-08-18 NOTE — Discharge Instructions (Addendum)
A referral has been sent to the wound clinic.  It is important that you follow-up to have your stitches removed. The wound care clinic will be calling you to schedule an appointment. If you do not hear from them in 1 week call the office to schedule the appointment   In the meantime you need to take Bactrim.  This is an antibiotic used to treat infection.  Prescription also sent for Norco.  This is a narcotic pain medicine.  Take this for severe pain only.  For mild to moderate pain you can continue taking Tylenol and ibuprofen.  -You can try over-the-counter lidocaine patches to help with your neck pain.  A pinched nerve will get better if you take mobic consistently for 5 days.  -Continue to clean wounds daily with soap and water

## 2020-10-02 ENCOUNTER — Ambulatory Visit (HOSPITAL_COMMUNITY)
Admission: EM | Admit: 2020-10-02 | Discharge: 2020-10-02 | Disposition: A | Discharge status: Home / Self Care | Payer: Self-pay | Attending: Family Medicine | Admitting: Family Medicine

## 2020-10-02 ENCOUNTER — Other Ambulatory Visit: Payer: Self-pay

## 2020-10-02 ENCOUNTER — Encounter (HOSPITAL_COMMUNITY): Payer: Self-pay

## 2020-10-02 DIAGNOSIS — L97929 Non-pressure chronic ulcer of unspecified part of left lower leg with unspecified severity: Secondary | ICD-10-CM

## 2020-10-02 DIAGNOSIS — Z5189 Encounter for other specified aftercare: Secondary | ICD-10-CM

## 2020-10-02 DIAGNOSIS — M542 Cervicalgia: Secondary | ICD-10-CM

## 2020-10-02 DIAGNOSIS — S060X0D Concussion without loss of consciousness, subsequent encounter: Secondary | ICD-10-CM

## 2020-10-02 MED ORDER — TIZANIDINE HCL 4 MG PO TABS: 4.0000 mg | ORAL_TABLET | Freq: Four times a day (QID) | ORAL | 0 refills | Status: AC | PRN

## 2020-10-02 MED ORDER — IBUPROFEN 800 MG PO TABS: 800.0000 mg | ORAL_TABLET | Freq: Three times a day (TID) | ORAL | 0 refills | Status: AC

## 2020-10-02 MED ORDER — HYDROCODONE-ACETAMINOPHEN 5-325 MG PO TABS
ORAL_TABLET | ORAL | Status: AC
  Filled 2020-10-02: qty 1

## 2020-10-02 MED ORDER — SULFAMETHOXAZOLE-TRIMETHOPRIM 800-160 MG PO TABS: 1.0000 | ORAL_TABLET | Freq: Two times a day (BID) | ORAL | 0 refills | Status: AC

## 2020-10-02 MED ORDER — HYDROCODONE-ACETAMINOPHEN 5-325 MG PO TABS
2.0000 | ORAL_TABLET | Freq: Once | ORAL | Status: AC
  Administered 2020-10-02: 2 via ORAL
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
## 2020-11-05 ENCOUNTER — Emergency Department (HOSPITAL_COMMUNITY): Admission: EM | Admit: 2020-11-05 | Discharge: 2020-11-05 | Discharge status: Against Medical Advice | Payer: Self-pay

## 2020-11-05 ENCOUNTER — Emergency Department (HOSPITAL_COMMUNITY)
Admission: EM | Admit: 2020-11-05 | Discharge: 2020-11-05 | Disposition: A | Discharge status: Home / Self Care | Payer: Self-pay | Attending: Emergency Medicine | Admitting: Emergency Medicine

## 2020-11-05 ENCOUNTER — Encounter (HOSPITAL_COMMUNITY): Payer: Self-pay

## 2020-11-05 ENCOUNTER — Other Ambulatory Visit: Payer: Self-pay

## 2020-11-05 ENCOUNTER — Emergency Department (HOSPITAL_BASED_OUTPATIENT_CLINIC_OR_DEPARTMENT_OTHER): Payer: Self-pay

## 2020-11-05 DIAGNOSIS — M7989 Other specified soft tissue disorders: Secondary | ICD-10-CM

## 2020-11-05 DIAGNOSIS — M79605 Pain in left leg: Secondary | ICD-10-CM | POA: Insufficient documentation

## 2020-11-05 DIAGNOSIS — F17210 Nicotine dependence, cigarettes, uncomplicated: Secondary | ICD-10-CM | POA: Insufficient documentation

## 2020-11-05 DIAGNOSIS — Z5900 Homelessness unspecified: Secondary | ICD-10-CM | POA: Insufficient documentation

## 2020-11-05 LAB — COMPREHENSIVE METABOLIC PANEL
ALT: 58 U/L — ABNORMAL HIGH (ref 0–44)
AST: 65 U/L — ABNORMAL HIGH (ref 15–41)
Albumin: 3.3 g/dL — ABNORMAL LOW (ref 3.5–5.0)
Alkaline Phosphatase: 66 U/L (ref 38–126)
Anion gap: 9 (ref 5–15)
BUN: 17 mg/dL (ref 6–20)
CO2: 26 mmol/L (ref 22–32)
Calcium: 8.9 mg/dL (ref 8.9–10.3)
Chloride: 104 mmol/L (ref 98–111)
Creatinine, Ser: 1.34 mg/dL — ABNORMAL HIGH (ref 0.61–1.24)
GFR, Estimated: >60 mL/min (ref >60)
Glucose, Bld: 105 mg/dL — ABNORMAL HIGH (ref 70–99)
Potassium: 3.9 mmol/L (ref 3.5–5.1)
Sodium: 139 mmol/L (ref 135–145)
Total Bilirubin: 0.3 mg/dL (ref 0.3–1.2)
Total Protein: 6.5 g/dL (ref 6.5–8.1)

## 2020-11-05 LAB — CBC WITH DIFFERENTIAL/PLATELET
Abs Immature Granulocytes: 0.01 10*3/uL (ref 0.00–0.07)
Basophils Absolute: 0 10*3/uL (ref 0.0–0.1)
Basophils Relative: 1 %
Eosinophils Absolute: 0.2 10*3/uL (ref 0.0–0.5)
Eosinophils Relative: 3 %
HCT: 38.9 % — ABNORMAL LOW (ref 39.0–52.0)
Hemoglobin: 12.3 g/dL — ABNORMAL LOW (ref 13.0–17.0)
Immature Granulocytes: 0 %
Lymphocytes Relative: 24 %
Lymphs Abs: 1.3 10*3/uL (ref 0.7–4.0)
MCH: 30.2 pg (ref 26.0–34.0)
MCHC: 31.6 g/dL (ref 30.0–36.0)
MCV: 95.6 fL (ref 80.0–100.0)
Monocytes Absolute: 0.6 10*3/uL (ref 0.1–1.0)
Monocytes Relative: 12 %
Neutro Abs: 3.2 10*3/uL (ref 1.7–7.7)
Neutrophils Relative %: 60 %
Platelets: 304 10*3/uL (ref 150–400)
RBC: 4.07 MIL/uL — ABNORMAL LOW (ref 4.22–5.81)
RDW: 13.4 % (ref 11.5–15.5)
WBC: 5.4 10*3/uL (ref 4.0–10.5)
nRBC: 0 % (ref 0.0–0.2)

## 2020-11-05 LAB — CK: Total CK: 866 U/L — ABNORMAL HIGH (ref 49–397)

## 2020-11-05 NOTE — ED Triage Notes (Signed)
Pt arrived via [facility]. Pt is homeless and per EMS had been walking around the streets most of the night. Per EMS pt did stay at a house for a few hours during the night, but then got kicked out. Pt states his legs hurt from walking around all night. Pt is A&Ox4. VSS.

## 2020-11-05 NOTE — Progress Notes (Signed)
VASCULAR LAB    Left lower extremity venous duplex has been performed.  See CV proc for preliminary results.  Messaged Dr. Dalene Seltzer with results.    Leyland Kenna, RVT 11/05/2020, 9:18 AM

## 2020-11-05 NOTE — ED Notes (Signed)
Pt transported to vascular.  °

## 2020-11-05 NOTE — ED Provider Notes (Signed)
[facility] EMERGENCY DEPARTMENT Provider Note   CSN: [id number] Arrival date & time: 11/05/20  0735     History Chief Complaint  Patient presents with  . homeless    Donald Hurst is a 29 y.o. male.  HPI      29 year old male with history of homelessness presents with concern for left lower extremity pain.  Patient reports that he has been walking around the streets for most of the night last night as well as the night before.  Reports yesterday he began to have left lower extremity pain that is worse with movement.  He denies fevers, chest pain, shortness of breath, nausea, vomiting, acute injuries.  Past Medical History:  Diagnosis Date  . Foreign body of right thumb 11/2018   glass  . History of MRSA infection    as a teenager - knee  . Migraines   . Stuffy and runny nose 11/23/2018   green drainage from nose, per pt.    There are no problems to display for this patient.   Past Surgical History:  Procedure Laterality Date  . FOREIGN BODY REMOVAL Right 11/29/2018   Procedure: RIGHT THUMB REMOVAL FOREIGN BODY MASS;  Surgeon: Betha Loa, MD;  Location: [facility];  Service: Orthopedics;  Laterality: Right;  . ORIF ANKLE FRACTURE Right   . SHOULDER ARTHROSCOPY W/ LABRAL REPAIR Right   . TONSILLECTOMY AND ADENOIDECTOMY  05/27/2001       No family history on file.  Social History   Tobacco Use  . Smoking status: Former Smoker    Packs/day: 0.25    Types: Cigarettes, Cigars  . Smokeless tobacco: Never Used  . Tobacco comment: smokes cigarettes and cigars daily  Vaping Use  . Vaping Use: Never used  Substance Use Topics  . Alcohol use: Not Currently    Comment: weekends  . Drug use: Not Currently    Frequency: 2.0 times per week    Types: Marijuana    Home Medications Prior to Admission medications   Medication Sig Start Date End Date Taking? Authorizing Provider  cephALEXin (KEFLEX) 500 MG capsule Take 1 capsule  (500 mg total) by mouth 4 (four) times daily. 08/03/20   Cathren Laine, MD  HYDROcodone-acetaminophen (NORCO/VICODIN) 5-325 MG tablet Take 1 tablet by mouth every 6 (six) hours as needed for severe pain. 08/18/20   Walisiewicz, Yvonna Alanis E, PA-C  ibuprofen (ADVIL) 800 MG tablet Take 1 tablet (800 mg total) by mouth 3 (three) times daily. 10/02/20   [name], MD  tiZANidine (ZANAFLEX) 4 MG tablet Take 1-2 tablets (4-8 mg total) by mouth every 6 (six) hours as needed for muscle spasms. 10/02/20   [name], MD    Allergies    Shrimp [shellfish allergy], Tramadol, and Tramadol  Review of Systems   Review of Systems  Constitutional: Negative for fever.  HENT: Negative for sore throat.   Eyes: Negative for visual disturbance.  Respiratory: Negative for shortness of breath.   Cardiovascular: Negative for chest pain.  Gastrointestinal: Negative for abdominal pain.  Genitourinary: Negative for difficulty urinating.  Musculoskeletal: Positive for arthralgias and myalgias. Negative for back pain and neck stiffness.  Skin: Negative for rash.  Neurological: Negative for syncope and headaches.    Physical Exam Updated Vital Signs BP 116/66   Pulse 91   Temp 97.8 F (36.6 C) (Oral)   Resp 18   Ht 6\' 3"  (1.905 m)   Wt 120.2 kg   SpO2 98%  BMI 33.12 kg/m   Physical Exam Vitals and nursing note reviewed.  Constitutional:      General: He is not in acute distress.    Appearance: Normal appearance. He is not ill-appearing, toxic-appearing or diaphoretic.  HENT:     Head: Normocephalic.  Eyes:     Conjunctiva/sclera: Conjunctivae normal.  Cardiovascular:     Rate and Rhythm: Normal rate and regular rhythm.     Pulses: Normal pulses.  Pulmonary:     Effort: Pulmonary effort is normal. No respiratory distress.  Musculoskeletal:        General: No deformity or signs of injury.     Cervical back: No rigidity.  Skin:    General: Skin is warm and dry.     Coloration: Skin  is not jaundiced or pale.  Neurological:     General: No focal deficit present.     Mental Status: He is alert and oriented to person, place, and time.     ED Results / Procedures / Treatments   Labs (all labs ordered are listed, but only abnormal results are displayed) Labs Reviewed  CBC WITH DIFFERENTIAL/PLATELET  COMPREHENSIVE METABOLIC PANEL  CK    EKG None  Radiology No results found.  Procedures Procedures (including critical care time)  Medications Ordered in ED Medications - No data to display  ED Course  I have reviewed the triage vital signs and the nursing notes.  Pertinent labs & imaging results that were available during my care of the patient were reviewed by me and considered in my medical decision making (see chart for details).    MDM Rules/Calculators/A&P                          29 year old male with history of homelessness presents with concern for left lower extremity pain after walking all night.  Do not see signs of cellulitis or septic arthritis.  Strong pulses bilaterally low suspicion for acute arterial occlusion.  Will obtain labs for evaluation of electrolyte abnormalities or rhabdomyolysis in setting of significant walking.  Will obtain DVT study given unilateral pain.   Labs show no acute abnormalities. Cr similar to prior values, CK mild elevation without evidence of rhabdomyolysis.  Recommend continued supportive care. Patient discharged in stable condition with understanding of reasons to return.      Final Clinical Impression(s) / ED Diagnoses Final diagnoses:  Left leg pain    Rx / DC Orders ED Discharge Orders    None       Alvira Monday, MD 11/06/20 0139

## 2020-11-05 NOTE — ED Notes (Signed)
Called pt back from waiting room x1. No response.

## 2020-11-29 ENCOUNTER — Encounter (HOSPITAL_COMMUNITY): Payer: Self-pay

## 2020-11-29 ENCOUNTER — Emergency Department (HOSPITAL_COMMUNITY)
Admission: EM | Admit: 2020-11-29 | Discharge: 2020-11-29 | Disposition: A | Discharge status: Home / Self Care | Payer: Self-pay | Attending: Emergency Medicine | Admitting: Emergency Medicine

## 2020-11-29 ENCOUNTER — Emergency Department (HOSPITAL_COMMUNITY)
Admission: EM | Admit: 2020-11-29 | Discharge: 2020-11-30 | Disposition: A | Discharge status: Home / Self Care | Payer: Self-pay | Attending: Emergency Medicine | Admitting: Emergency Medicine

## 2020-11-29 ENCOUNTER — Other Ambulatory Visit: Payer: Self-pay

## 2020-11-29 ENCOUNTER — Encounter (HOSPITAL_COMMUNITY): Payer: Self-pay | Admitting: Emergency Medicine

## 2020-11-29 DIAGNOSIS — Z5321 Procedure and treatment not carried out due to patient leaving prior to being seen by health care provider: Secondary | ICD-10-CM | POA: Insufficient documentation

## 2020-11-29 DIAGNOSIS — Z87891 Personal history of nicotine dependence: Secondary | ICD-10-CM | POA: Insufficient documentation

## 2020-11-29 DIAGNOSIS — R1110 Vomiting, unspecified: Secondary | ICD-10-CM

## 2020-11-29 DIAGNOSIS — R110 Nausea: Secondary | ICD-10-CM | POA: Insufficient documentation

## 2020-11-29 DIAGNOSIS — Z5900 Homelessness unspecified: Secondary | ICD-10-CM | POA: Insufficient documentation

## 2020-11-29 DIAGNOSIS — R112 Nausea with vomiting, unspecified: Secondary | ICD-10-CM | POA: Insufficient documentation

## 2020-11-29 HISTORY — DX: Homelessness unspecified: Z59.00

## 2020-11-29 LAB — URINALYSIS, ROUTINE W REFLEX MICROSCOPIC
Bilirubin Urine: NEGATIVE
Glucose, UA: NEGATIVE mg/dL
Hgb urine dipstick: NEGATIVE
Ketones, ur: NEGATIVE mg/dL
Nitrite: NEGATIVE
Protein, ur: 30 mg/dL — AB
Specific Gravity, Urine: 1.029 (ref 1.005–1.030)
pH: 7 (ref 5.0–8.0)

## 2020-11-29 LAB — COMPREHENSIVE METABOLIC PANEL
ALT: 97 U/L — ABNORMAL HIGH (ref 0–44)
AST: 103 U/L — ABNORMAL HIGH (ref 15–41)
Albumin: 3.4 g/dL — ABNORMAL LOW (ref 3.5–5.0)
Alkaline Phosphatase: 85 U/L (ref 38–126)
Anion gap: 11 (ref 5–15)
BUN: 16 mg/dL (ref 6–20)
CO2: 25 mmol/L (ref 22–32)
Calcium: 9 mg/dL (ref 8.9–10.3)
Chloride: 105 mmol/L (ref 98–111)
Creatinine, Ser: 1.36 mg/dL — ABNORMAL HIGH (ref 0.61–1.24)
GFR, Estimated: >60 mL/min (ref >60)
Glucose, Bld: 118 mg/dL — ABNORMAL HIGH (ref 70–99)
Potassium: 3.8 mmol/L (ref 3.5–5.1)
Sodium: 141 mmol/L (ref 135–145)
Total Bilirubin: 0.4 mg/dL (ref 0.3–1.2)
Total Protein: 6.7 g/dL (ref 6.5–8.1)

## 2020-11-29 LAB — CBC
HCT: 45.9 % (ref 39.0–52.0)
Hemoglobin: 14.4 g/dL (ref 13.0–17.0)
MCH: 29.4 pg (ref 26.0–34.0)
MCHC: 31.4 g/dL (ref 30.0–36.0)
MCV: 93.9 fL (ref 80.0–100.0)
Platelets: 353 10*3/uL (ref 150–400)
RBC: 4.89 MIL/uL (ref 4.22–5.81)
RDW: 14.1 % (ref 11.5–15.5)
WBC: 6.2 10*3/uL (ref 4.0–10.5)
nRBC: 0 % (ref 0.0–0.2)

## 2020-11-29 LAB — LIPASE, BLOOD: Lipase: 104 U/L — ABNORMAL HIGH (ref 11–51)

## 2020-11-29 MED ORDER — ONDANSETRON 4 MG PO TBDP
4.0000 mg | ORAL_TABLET | Freq: Once | ORAL | Status: AC | PRN
  Administered 2020-11-29: 4 mg via ORAL
  Filled 2020-11-29: qty 1

## 2020-11-29 MED ORDER — IBUPROFEN 400 MG PO TABS
600.0000 mg | ORAL_TABLET | Freq: Once | ORAL | Status: AC
  Administered 2020-11-29: 600 mg via ORAL
  Filled 2020-11-29: qty 1

## 2020-11-29 NOTE — ED Triage Notes (Signed)
Patient here for homelessness and requesting meal tray. Patient alert and sleeping throughout the assessment.

## 2020-11-29 NOTE — ED Triage Notes (Signed)
Patient reports nausea and vomiting this evening , seen here this afternoon ( homeless) and was discharged home .

## 2020-11-29 NOTE — ED Notes (Signed)
Pt d/c per MD order. Pt verbalizes understanding. Ambulatory off unit. No s/s of acute distress noted. Second "ED happy meal" provided per pt request.

## 2020-11-29 NOTE — ED Notes (Signed)
Pt refused final set of vitals

## 2020-11-29 NOTE — ED Triage Notes (Signed)
Pt came in by [facility] telling them he had run out of meds  No answer when his name was called to be triaged

## 2020-11-29 NOTE — ED Notes (Signed)
Pt has been asleep in lobby. Pt states he still wants to be evaluated.

## 2020-11-29 NOTE — ED Provider Notes (Signed)
[facility] EMERGENCY DEPARTMENT Provider Note   CSN: [id number] Arrival date & time: 11/29/20  1448     History Chief Complaint  Patient presents with  . Homeless    Donald Hurst is a 29 y.o. male with a history of homelessness presented to emergency department requesting food and rest.  Patient is minimally verbal during my exam.  He is sleeping on the stretcher.  After several attempts to wake him up, he tells me that all that he wants his food.  He has generalized muscle aches but is sleeping in a chair outside.  There is no acute medical complaint.  HPI     Past Medical History:  Diagnosis Date  . Foreign body of right thumb 11/2018   glass  . History of MRSA infection    as a teenager - knee  . Migraines   . Stuffy and runny nose 11/23/2018   green drainage from nose, per pt.    There are no problems to display for this patient.   Past Surgical History:  Procedure Laterality Date  . FOREIGN BODY REMOVAL Right 11/29/2018   Procedure: RIGHT THUMB REMOVAL FOREIGN BODY MASS;  Surgeon: Betha Loa, MD;  Location: [facility];  Service: Orthopedics;  Laterality: Right;  . ORIF ANKLE FRACTURE Right   . SHOULDER ARTHROSCOPY W/ LABRAL REPAIR Right   . TONSILLECTOMY AND ADENOIDECTOMY  05/27/2001       No family history on file.  Social History   Tobacco Use  . Smoking status: Former Smoker    Packs/day: 0.25    Types: Cigarettes, Cigars  . Smokeless tobacco: Never Used  . Tobacco comment: smokes cigarettes and cigars daily  Vaping Use  . Vaping Use: Never used  Substance Use Topics  . Alcohol use: Not Currently    Comment: weekends  . Drug use: Not Currently    Frequency: 2.0 times per week    Types: Marijuana    Home Medications Prior to Admission medications   Medication Sig Start Date End Date Taking? Authorizing Provider  cephALEXin (KEFLEX) 500 MG capsule Take 1 capsule (500 mg total) by mouth 4 (four) times daily.  08/03/20   Cathren Laine, MD  HYDROcodone-acetaminophen (NORCO/VICODIN) 5-325 MG tablet Take 1 tablet by mouth every 6 (six) hours as needed for severe pain. 08/18/20   Walisiewicz, Yvonna Alanis E, PA-C  ibuprofen (ADVIL) 800 MG tablet Take 1 tablet (800 mg total) by mouth 3 (three) times daily. 10/02/20   [name], MD  tiZANidine (ZANAFLEX) 4 MG tablet Take 1-2 tablets (4-8 mg total) by mouth every 6 (six) hours as needed for muscle spasms. 10/02/20   [name], MD    Allergies    Shrimp [shellfish allergy], Tramadol, and Tramadol  Review of Systems   Review of Systems  Reason unable to perform ROS: Patient noncompliant with questioning.    Physical Exam Updated Vital Signs BP 133/86   Pulse 73   Resp 14   SpO2 99%   Physical Exam Vitals and nursing note reviewed.  Constitutional:      Appearance: He is well-developed and well-nourished.  HENT:     Head: Normocephalic and atraumatic.  Eyes:     Comments: Sclera injected bilaterally  Cardiovascular:     Rate and Rhythm: Normal rate and regular rhythm.  Pulmonary:     Effort: Pulmonary effort is normal. No respiratory distress.  Abdominal:     Palpations: Abdomen is soft.  Tenderness: There is no abdominal tenderness.  Musculoskeletal:        General: No edema.     Cervical back: Neck supple.  Skin:    General: Skin is warm and dry.  Neurological:     General: No focal deficit present.     Mental Status: He is alert and oriented to person, place, and time.  Psychiatric:        Mood and Affect: Mood and affect normal.     ED Results / Procedures / Treatments   Labs (all labs ordered are listed, but only abnormal results are displayed) Labs Reviewed - No data to display  EKG None  Radiology No results found.  Procedures Procedures (including critical care time)  Medications Ordered in ED Medications  ibuprofen (ADVIL) tablet 600 mg (600 mg Oral Given 11/29/20 0845)    ED Course  I have  reviewed the triage vital signs and the nursing notes.  Pertinent labs & imaging results that were available during my care of the patient were reviewed by me and considered in my medical decision making (see chart for details).  This is a 29 year old male who is undomiciled presenting to the emergency department requesting food.  He reports generalized myalgias and has been sleeping outside.  We can give some food here and ibuprofen.  Unfortunately explained to him with the busy emergency department he cannot sleep here.  He will be discharged with shelter information.   Final Clinical Impression(s) / ED Diagnoses Final diagnoses:  Homelessness    Rx / DC Orders ED Discharge Orders    None       Trell Secrist, Kermit Balo, MD 11/29/20 [phone]
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
## 2020-11-29 NOTE — ED Notes (Signed)
Pt provided "ED happy meal"

## 2020-11-30 ENCOUNTER — Encounter (HOSPITAL_COMMUNITY): Payer: Self-pay | Admitting: Emergency Medicine

## 2020-11-30 ENCOUNTER — Emergency Department (HOSPITAL_COMMUNITY)
Admission: EM | Admit: 2020-11-30 | Discharge: 2020-11-30 | Disposition: A | Discharge status: Home / Self Care | Payer: Self-pay | Attending: Emergency Medicine | Admitting: Emergency Medicine

## 2020-11-30 ENCOUNTER — Other Ambulatory Visit: Payer: Self-pay

## 2020-11-30 ENCOUNTER — Emergency Department (HOSPITAL_COMMUNITY): Payer: Self-pay

## 2020-11-30 DIAGNOSIS — T148XXA Other injury of unspecified body region, initial encounter: Secondary | ICD-10-CM

## 2020-11-30 DIAGNOSIS — S90411A Abrasion, right great toe, initial encounter: Secondary | ICD-10-CM | POA: Insufficient documentation

## 2020-11-30 DIAGNOSIS — W228XXA Striking against or struck by other objects, initial encounter: Secondary | ICD-10-CM | POA: Insufficient documentation

## 2020-11-30 DIAGNOSIS — M79674 Pain in right toe(s): Secondary | ICD-10-CM

## 2020-11-30 DIAGNOSIS — Z87891 Personal history of nicotine dependence: Secondary | ICD-10-CM | POA: Insufficient documentation

## 2020-11-30 DIAGNOSIS — Y9339 Activity, other involving climbing, rappelling and jumping off: Secondary | ICD-10-CM | POA: Insufficient documentation

## 2020-11-30 MED ORDER — ACETAMINOPHEN 325 MG PO TABS: 650.0000 mg | ORAL_TABLET | Freq: Once | ORAL | Status: DC

## 2020-11-30 MED ORDER — BACITRACIN ZINC 500 UNIT/GM EX OINT
TOPICAL_OINTMENT | Freq: Two times a day (BID) | CUTANEOUS | Status: DC
  Administered 2020-11-30: 1 via TOPICAL
  Filled 2020-11-30: qty 0.9

## 2020-11-30 MED ORDER — IBUPROFEN 400 MG PO TABS
600.0000 mg | ORAL_TABLET | Freq: Once | ORAL | Status: AC
  Administered 2020-11-30: 600 mg via ORAL
  Filled 2020-11-30: qty 1

## 2020-11-30 NOTE — ED Provider Notes (Signed)
[facility] EMERGENCY DEPARTMENT Provider Note   CSN: [id number] Arrival date & time: 11/29/20  2130     History Chief Complaint  Patient presents with  . Emesis    Donald Hurst is a 29 y.o. male who presents emergency department with vomiting. Patient has been in the emergency department overnight. He has a history of multiple ER visits that primarily have to do with his homelessness. He was apparently having some vomiting last night but is no longer vomiting and asking if he can have a sandwich. He has no abdominal pain, fever, chills.  HPI     Past Medical History:  Diagnosis Date  . Foreign body of right thumb 11/2018   glass  . History of MRSA infection    as a teenager - knee  . Homeless   . Migraines   . Stuffy and runny nose 11/23/2018   green drainage from nose, per pt.    There are no problems to display for this patient.   Past Surgical History:  Procedure Laterality Date  . FOREIGN BODY REMOVAL Right 11/29/2018   Procedure: RIGHT THUMB REMOVAL FOREIGN BODY MASS;  Surgeon: Donald Loa, MD;  Location: [facility];  Service: Orthopedics;  Laterality: Right;  . ORIF ANKLE FRACTURE Right   . SHOULDER ARTHROSCOPY W/ LABRAL REPAIR Right   . TONSILLECTOMY AND ADENOIDECTOMY  05/27/2001       No family history on file.  Social History   Tobacco Use  . Smoking status: Former Smoker    Packs/day: 0.25    Types: Cigarettes, Cigars  . Smokeless tobacco: Never Used  . Tobacco comment: smokes cigarettes and cigars daily  Vaping Use  . Vaping Use: Never used  Substance Use Topics  . Alcohol use: Not Currently    Comment: weekends  . Drug use: Not Currently    Frequency: 2.0 times per week    Types: Marijuana    Home Medications Prior to Admission medications   Medication Sig Start Date End Date Taking? Authorizing Provider  cephALEXin (KEFLEX) 500 MG capsule Take 1 capsule (500 mg total) by mouth 4 (four) times daily.  08/03/20   Donald Laine, MD  HYDROcodone-acetaminophen (NORCO/VICODIN) 5-325 MG tablet Take 1 tablet by mouth every 6 (six) hours as needed for severe pain. 08/18/20   Walisiewicz, Yvonna Alanis E, PA-C  ibuprofen (ADVIL) 800 MG tablet Take 1 tablet (800 mg total) by mouth 3 (three) times daily. 10/02/20   Donald Moore, MD  tiZANidine (ZANAFLEX) 4 MG tablet Take 1-2 tablets (4-8 mg total) by mouth every 6 (six) hours as needed for muscle spasms. 10/02/20   Donald Moore, MD    Allergies    Shrimp [shellfish allergy], Tramadol, and Tramadol  Review of Systems   Review of Systems Ten systems reviewed and are negative for acute change, except as noted in the HPI.   Physical Exam Updated Vital Signs BP (!) 122/93 (BP Location: Right Arm)   Pulse 69   Temp 98.3 F (36.8 C) (Oral)   Resp 17   Ht 6\' 3"  (1.905 m)   Wt 125 kg   SpO2 98%   BMI 34.44 kg/m   Physical Exam Vitals and nursing note reviewed.  Constitutional:      General: He is not in acute distress.    Appearance: He is well-developed and well-nourished. He is not diaphoretic.  HENT:     Head: Normocephalic and atraumatic.  Eyes:     General:  No scleral icterus.    Conjunctiva/sclera: Conjunctivae normal.  Cardiovascular:     Rate and Rhythm: Normal rate and regular rhythm.     Heart sounds: Normal heart sounds.  Pulmonary:     Effort: Pulmonary effort is normal. No respiratory distress.     Breath sounds: Normal breath sounds.  Abdominal:     General: There is no distension.     Palpations: Abdomen is soft.     Tenderness: There is no abdominal tenderness.  Musculoskeletal:        General: No edema.     Cervical back: Normal range of motion and neck supple.  Skin:    General: Skin is warm and dry.  Neurological:     Mental Status: He is alert.  Psychiatric:        Behavior: Behavior normal.     ED Results / Procedures / Treatments   Labs (all labs ordered are listed, but only abnormal results are  displayed) Labs Reviewed  LIPASE, BLOOD - Abnormal; Notable for the following components:      Result Value   Lipase 104 (*)    All other components within normal limits  COMPREHENSIVE METABOLIC PANEL - Abnormal; Notable for the following components:   Glucose, Bld 118 (*)    Creatinine, Ser 1.36 (*)    Albumin 3.4 (*)    AST 103 (*)    ALT 97 (*)    All other components within normal limits  URINALYSIS, ROUTINE W REFLEX MICROSCOPIC - Abnormal; Notable for the following components:   APPearance CLOUDY (*)    Protein, ur 30 (*)    Leukocytes,Ua SMALL (*)    Bacteria, UA RARE (*)    All other components within normal limits  CBC    EKG None  Radiology No results found.  Procedures Procedures (including critical care time)  Medications Ordered in ED Medications  ondansetron (ZOFRAN-ODT) disintegrating tablet 4 mg (4 mg Oral Given 11/29/20 2143)    ED Course  I have reviewed the triage vital signs and the nursing notes.  Pertinent labs & imaging results that were available during my care of the patient were reviewed by me and considered in my medical decision making (see chart for details).    MDM Rules/Calculators/A&P                          VS:  Vitals:   11/29/20 2138 11/29/20 2256 11/30/20 0132 11/30/20 0409  BP: (!) 142/88 (!) 115/96 (!) 129/108 (!) 122/93  Pulse: 90 86 81 69  Resp: 16 18 18 17   Temp: 98 F (36.7 C) 98.2 F (36.8 C) 98.1 F (36.7 C) 98.3 F (36.8 C)  TempSrc: Oral Oral Oral Oral  SpO2: 100% 98% 100% 98%  Weight:      Height:        is gathered by patient and emr. Previous records obtained and reviewed.  RD:EYCXKGY Donald Hurst has presented with complaint of vomiting. I have reviewed the labs which show CBC WNL CMP with chronic renal insufficiency, mildly elevated liver enzymes and elevated Lipase support recent etoh use UA equivocal, but patient denies urinary sxs  The differential diagnosis for Donald Hurst is extensive and  thus medical decision making is of high complexity. After review of the data points in the case the patient's presentation is most consistent with non-intractable vomiting and suspected etoh use. The presentation is not consistent with emergent causes such as  acute coronary syndrome, acute radiation syndrome, acute gastric dilatation, acetaminophen toxicity, adrenal insufficiency, appendicitis, salicylate toxicity, bowel obstruction or ileus, carbon monoxide poisoning, cholecystitis, digoxin toxicity, CNS tumor elevated intracranial pressures, outlet obstruction or gastric volvulus, incarcerated hernia, hyperemesis gravidarum, pancreatitis, peritonitis, ruptured viscus, testicular or ovarian torsion, or theophylline toxicity.  Similarly the presentation is not consistent with biliary colic, cannabinoid hyperemesis syndrome, chemotherapy reaction, gastroparesis, vertigo, migraine, motion sickness, narcotic withdrawal, peptic ulcer disease, renal colic or urinary tract infection.  Repeat examination of the patient shows a benign abdomen and the patient is tolerating p.o. fluids.   I discussed all of the findings with the patient and I have delivered strict return precautions personally or by detailed written instruction delivered verbally by nursing staff to the patient/caregiver/family member.   Data Reviewed/Counseling: I have reviewed the patient's vital signs, nursing notes, and other relevant tests/information. I had a detailed discussion regarding the historical points, exam findings, and any diagnostic results supporting the discharge diagnosis. I also discussed the need for outpatient follow-up and the need to return to the ED if symptoms worsen or if there are any questions or concerns that arise at home.        Final Clinical Impression(s) / ED Diagnoses Final diagnoses:  Non-intractable vomiting, presence of nausea not specified, unspecified vomiting type    Rx / DC Orders ED  Discharge Orders    None       Arthor Captain, PA-C 11/30/20 6226    Terald Sleeper, MD 11/30/20 1239

## 2020-11-30 NOTE — ED Notes (Signed)
Right great toe abrasion cleaned, bacitracin and dressing applied. Great toe buddy taped to second toe per instruction. +PMS intact distal to bandage.

## 2020-11-30 NOTE — ED Provider Notes (Signed)
[facility] EMERGENCY DEPARTMENT Provider Note   CSN: [id number] Arrival date & time: 11/30/20  1619     History Chief Complaint  Patient presents with  . Foot Pain    Donald Hurst is a 29 y.o. male with past medical history of homelessness, who presents today for evaluation of pain in his right great toe.  He reports that he jumped a fence today and hurt his big toe on his right foot when it bent forcibly.  He is unsure when his last tetanus shot was, chart review shows that it was in August of this year.  He denies any other injuries.  His pain is made worse with movement and walking.  No interventions prior to arrival.  HPI     Past Medical History:  Diagnosis Date  . Foreign body of right thumb 11/2018   glass  . History of MRSA infection    as a teenager - knee  . Homeless   . Migraines   . Stuffy and runny nose 11/23/2018   green drainage from nose, per pt.    There are no problems to display for this patient.   Past Surgical History:  Procedure Laterality Date  . FOREIGN BODY REMOVAL Right 11/29/2018   Procedure: RIGHT THUMB REMOVAL FOREIGN BODY MASS;  Surgeon: Betha Loa, MD;  Location: [facility];  Service: Orthopedics;  Laterality: Right;  . ORIF ANKLE FRACTURE Right   . SHOULDER ARTHROSCOPY W/ LABRAL REPAIR Right   . TONSILLECTOMY AND ADENOIDECTOMY  05/27/2001       History reviewed. No pertinent family history.  Social History   Tobacco Use  . Smoking status: Former Smoker    Packs/day: 0.25    Types: Cigarettes, Cigars  . Smokeless tobacco: Never Used  . Tobacco comment: smokes cigarettes and cigars daily  Vaping Use  . Vaping Use: Never used  Substance Use Topics  . Alcohol use: Not Currently    Comment: weekends  . Drug use: Not Currently    Frequency: 2.0 times per week    Types: Marijuana    Home Medications Prior to Admission medications   Medication Sig Start Date End Date Taking? Authorizing  Provider  cephALEXin (KEFLEX) 500 MG capsule Take 1 capsule (500 mg total) by mouth 4 (four) times daily. 08/03/20   Cathren Laine, MD  HYDROcodone-acetaminophen (NORCO/VICODIN) 5-325 MG tablet Take 1 tablet by mouth every 6 (six) hours as needed for severe pain. 08/18/20   Walisiewicz, Yvonna Alanis E, PA-C  ibuprofen (ADVIL) 800 MG tablet Take 1 tablet (800 mg total) by mouth 3 (three) times daily. 10/02/20   [name], MD  tiZANidine (ZANAFLEX) 4 MG tablet Take 1-2 tablets (4-8 mg total) by mouth every 6 (six) hours as needed for muscle spasms. 10/02/20   [name], MD    Allergies    Shrimp [shellfish allergy], Tramadol, and Tramadol  Review of Systems   Review of Systems  Constitutional: Negative for chills and fever.  Musculoskeletal:       Pain in right great toe  Skin: Positive for wound.    Physical Exam Updated Vital Signs BP (!) 143/103 (BP Location: Right Arm)   Pulse 92   Temp 99 F (37.2 C) (Oral)   Resp 16   SpO2 97%   Physical Exam Vitals and nursing note reviewed.  Constitutional:      General: He is not in acute distress. HENT:     Head: Normocephalic and atraumatic.  Cardiovascular:     Rate and Rhythm: Normal rate.     Comments: 2+ right DP/PT pulse. Pulmonary:     Effort: Pulmonary effort is normal. No respiratory distress.  Musculoskeletal:     Cervical back: No rigidity.     Comments: Minimal edema on the base of the right great toe.  Patient is able to move the right great toe.  No tenderness to palpation through the right foot and lower leg.  Skin:    Comments: 1cm abrasion on the dorsum of the right great toe with out active bleeding.  No secondary infection.  Thickened and dystrophic toenails.  Neurological:     Mental Status: He is alert. Mental status is at baseline.     Comments: Awake and alert, answers all questions appropriately.  Speech is not slurred.    Psychiatric:        Mood and Affect: Mood normal.     ED Results /  Procedures / Treatments   Labs (all labs ordered are listed, but only abnormal results are displayed) Labs Reviewed - No data to display  EKG None  Radiology DG Toe Great Right  Result Date: 11/30/2020 CLINICAL DATA:  Toe pain. EXAM: RIGHT GREAT TOE COMPARISON:  None. FINDINGS: There is no acute displaced fracture or dislocation. There are degenerative changes of the first metatarsophalangeal joint. There is no radiopaque foreign body. There is mild soft tissue swelling about the forefoot. IMPRESSION: 1. No acute displaced fracture or dislocation. 2. Degenerative changes of the first metatarsophalangeal joint. Electronically Signed   By: Katherine Mantle M.D.   On: 11/30/2020 19:30    Procedures Procedures (including critical care time)  Medications Ordered in ED Medications  bacitracin ointment (1 application Topical Given 11/30/20 2006)  ibuprofen (ADVIL) tablet 600 mg (600 mg Oral Given 11/30/20 1904)    ED Course  I have reviewed the triage vital signs and the nursing notes.  Pertinent labs & imaging results that were available during my care of the patient were reviewed by me and considered in my medical decision making (see chart for details).    MDM Rules/Calculators/A&P                         Patient is a 29 year old man who presents today for evaluation of pain in his right great toe after he injured it while reportedly jumping a fence earlier today.  Chart review shows his tetanus is up-to-date.  Superficial wound.  X-rays without fracture, dislocation or other acute abnormality.  No secondary infection at this time.  Antibiotic ointment.  Wound care.  OTC medications as needed for pain.  Patient requested hydrocodone, however in the absence of fracture, and the overall reassuring physical exam I do not think that this is appropriate.  Of note patient had mentioned in triage "Pt states he is homeless and wants to get out of the cold and get food."    Return  precautions were discussed with patient who states their understanding.  At the time of discharge patient denied any unaddressed complaints or concerns.  Patient is agreeable for discharge home.  Note: Portions of this report may have been transcribed using voice recognition software. Every effort was made to ensure accuracy; however, inadvertent computerized transcription errors may be present   Final Clinical Impression(s) / ED Diagnoses Final diagnoses:  Great toe pain, right  Abrasion    Rx / DC Orders ED Discharge Orders    None  Norman Clay 11/30/20 2157    [name], MD 12/04/20 1539

## 2020-11-30 NOTE — ED Notes (Signed)
Pt to xray

## 2020-11-30 NOTE — ED Triage Notes (Signed)
Patient arrives to ED with complaints of left foot pain and homelessness. States he feel jumping a fence and hurt his big toe on right foot. Pt states he is homeless and wants to get out of the cold and get food.

## 2020-11-30 NOTE — Discharge Instructions (Addendum)
Your x-rays didn't show any broken bones.   Please keep your wound clean, dry, and covered.  Please put a thin layer of antibiotic ointment on it twice a day.  Please do not soak or submerge your foot as this can increase the chances of your wound getting infected.  Please take Ibuprofen (Advil, motrin) and Tylenol (acetaminophen) to relieve your pain.  You may take up to 600 MG (3 pills) of normal strength ibuprofen every 8 hours as needed.  In between doses of ibuprofen you make take tylenol, up to 1,000 mg (two extra strength pills).  Do not take more than 3,000 mg tylenol in a 24 hour period.  Please check all medication labels as many medications such as pain and cold medications may contain tylenol.  Do not drink alcohol while taking these medications.  Do not take other NSAID'S while taking ibuprofen (such as aleve or naproxen).  Please take ibuprofen with food to decrease stomach upset.

## 2020-11-30 NOTE — ED Notes (Signed)
Pt returned from radiology.

## 2020-11-30 NOTE — Discharge Instructions (Addendum)

## 2020-12-31 ENCOUNTER — Emergency Department (HOSPITAL_COMMUNITY): Payer: Self-pay

## 2020-12-31 ENCOUNTER — Encounter (HOSPITAL_COMMUNITY): Payer: Self-pay | Admitting: Emergency Medicine

## 2020-12-31 ENCOUNTER — Emergency Department (HOSPITAL_COMMUNITY)
Admission: EM | Admit: 2020-12-31 | Discharge: 2021-01-01 | Disposition: A | Discharge status: Home / Self Care | Payer: Self-pay | Attending: Emergency Medicine | Admitting: Emergency Medicine

## 2020-12-31 DIAGNOSIS — R197 Diarrhea, unspecified: Secondary | ICD-10-CM | POA: Insufficient documentation

## 2020-12-31 DIAGNOSIS — Z87891 Personal history of nicotine dependence: Secondary | ICD-10-CM | POA: Insufficient documentation

## 2020-12-31 DIAGNOSIS — R519 Headache, unspecified: Secondary | ICD-10-CM | POA: Insufficient documentation

## 2020-12-31 DIAGNOSIS — R1084 Generalized abdominal pain: Secondary | ICD-10-CM | POA: Insufficient documentation

## 2020-12-31 DIAGNOSIS — R112 Nausea with vomiting, unspecified: Secondary | ICD-10-CM | POA: Insufficient documentation

## 2020-12-31 LAB — CBC WITH DIFFERENTIAL/PLATELET
Abs Immature Granulocytes: 0.01 10*3/uL (ref 0.00–0.07)
Basophils Absolute: 0 10*3/uL (ref 0.0–0.1)
Basophils Relative: 1 %
Eosinophils Absolute: 0.2 10*3/uL (ref 0.0–0.5)
Eosinophils Relative: 3 %
HCT: 42.2 % (ref 39.0–52.0)
Hemoglobin: 13.8 g/dL (ref 13.0–17.0)
Immature Granulocytes: 0 %
Lymphocytes Relative: 20 %
Lymphs Abs: 1.3 10*3/uL (ref 0.7–4.0)
MCH: 30.5 pg (ref 26.0–34.0)
MCHC: 32.7 g/dL (ref 30.0–36.0)
MCV: 93.2 fL (ref 80.0–100.0)
Monocytes Absolute: 0.6 10*3/uL (ref 0.1–1.0)
Monocytes Relative: 9 %
Neutro Abs: 4.3 10*3/uL (ref 1.7–7.7)
Neutrophils Relative %: 67 %
Platelets: 337 10*3/uL (ref 150–400)
RBC: 4.53 MIL/uL (ref 4.22–5.81)
RDW: 15.4 % (ref 11.5–15.5)
WBC: 6.4 10*3/uL (ref 4.0–10.5)
nRBC: 0 % (ref 0.0–0.2)

## 2020-12-31 LAB — COMPREHENSIVE METABOLIC PANEL
ALT: 110 U/L — ABNORMAL HIGH (ref 0–44)
AST: 125 U/L — ABNORMAL HIGH (ref 15–41)
Albumin: 3.3 g/dL — ABNORMAL LOW (ref 3.5–5.0)
Alkaline Phosphatase: 100 U/L (ref 38–126)
Anion gap: 9 (ref 5–15)
BUN: 14 mg/dL (ref 6–20)
CO2: 26 mmol/L (ref 22–32)
Calcium: 8.5 mg/dL — ABNORMAL LOW (ref 8.9–10.3)
Chloride: 99 mmol/L (ref 98–111)
Creatinine, Ser: 1.27 mg/dL — ABNORMAL HIGH (ref 0.61–1.24)
GFR, Estimated: >60 mL/min (ref >60)
Glucose, Bld: 111 mg/dL — ABNORMAL HIGH (ref 70–99)
Potassium: 3.6 mmol/L (ref 3.5–5.1)
Sodium: 134 mmol/L — ABNORMAL LOW (ref 135–145)
Total Bilirubin: 0.2 mg/dL — ABNORMAL LOW (ref 0.3–1.2)
Total Protein: 6.6 g/dL (ref 6.5–8.1)

## 2020-12-31 LAB — LIPASE, BLOOD: Lipase: 90 U/L — ABNORMAL HIGH (ref 11–51)

## 2020-12-31 MED ORDER — SODIUM CHLORIDE 0.9 % IV BOLUS
500.0000 mL | Freq: Once | INTRAVENOUS | Status: AC
  Administered 2020-12-31: 500 mL via INTRAVENOUS

## 2020-12-31 MED ORDER — IBUPROFEN 800 MG PO TABS
800.0000 mg | ORAL_TABLET | Freq: Once | ORAL | Status: AC
  Administered 2020-12-31: 800 mg via ORAL

## 2020-12-31 MED ORDER — ONDANSETRON HCL 4 MG PO TABS: 4.0000 mg | ORAL_TABLET | Freq: Three times a day (TID) | ORAL | 0 refills | Status: AC | PRN

## 2020-12-31 MED ORDER — DICYCLOMINE HCL 20 MG PO TABS: 20.0000 mg | ORAL_TABLET | Freq: Two times a day (BID) | ORAL | 0 refills | Status: AC

## 2020-12-31 MED ORDER — DICYCLOMINE HCL 10 MG PO CAPS: 10.0000 mg | ORAL_CAPSULE | Freq: Once | ORAL | Status: DC

## 2020-12-31 MED ORDER — MORPHINE SULFATE (PF) 2 MG/ML IV SOLN
2.0000 mg | Freq: Once | INTRAVENOUS | Status: AC
  Administered 2020-12-31: 2 mg via INTRAVENOUS
  Filled 2020-12-31: qty 1

## 2020-12-31 MED ORDER — DICYCLOMINE HCL 10 MG PO CAPS
10.0000 mg | ORAL_CAPSULE | Freq: Once | ORAL | Status: AC
  Administered 2020-12-31: 10 mg via ORAL
  Filled 2020-12-31: qty 1

## 2020-12-31 MED ORDER — METOCLOPRAMIDE HCL 5 MG/ML IJ SOLN
10.0000 mg | Freq: Once | INTRAMUSCULAR | Status: AC
  Administered 2020-12-31: 10 mg via INTRAVENOUS
  Filled 2020-12-31: qty 2

## 2020-12-31 MED ORDER — ONDANSETRON 4 MG PO TBDP
4.0000 mg | ORAL_TABLET | Freq: Once | ORAL | Status: AC
Start: 2020-12-31 — End: 2020-12-31
  Administered 2020-12-31: 4 mg via ORAL
  Filled 2020-12-31: qty 1

## 2020-12-31 MED ORDER — IOHEXOL 300 MG/ML  SOLN
100.0000 mL | Freq: Once | INTRAMUSCULAR | Status: AC | PRN
  Administered 2020-12-31: 100 mL via INTRAVENOUS

## 2020-12-31 NOTE — ED Triage Notes (Signed)
Pt reports couple of days of headache, today the headache worsened. Denies recent fevers. Pt awake, alert, appropriate. VSS.

## 2020-12-31 NOTE — ED Notes (Signed)
Called patient several times for a vital check patient didn't answe

## 2020-12-31 NOTE — Discharge Instructions (Addendum)
You are seen here for abdominal pain.  Lab work and imaging all looks reassuring.  I have given you prescription for Zofran please use as needed for nausea.  I will also give you a prescription for Bentyl which will help with abdominal cramping please use as needed.  I recommend following up with a primary care provider for further evaluation.  Given you the contact information for [facility] please contact them to help you find a primary care provider.  Would like you to have further evaluation of an large ileoccolic lymph nodes  Come back to the emergency department if you develop chest pain, shortness of breath, severe abdominal pain, uncontrolled nausea, vomiting, diarrhea.
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
## 2021-01-01 ENCOUNTER — Emergency Department (HOSPITAL_COMMUNITY)
Admission: EM | Admit: 2021-01-01 | Discharge: 2021-01-01 | Disposition: A | Discharge status: Home / Self Care | Payer: Self-pay | Attending: Emergency Medicine | Admitting: Emergency Medicine

## 2021-01-01 DIAGNOSIS — Z87891 Personal history of nicotine dependence: Secondary | ICD-10-CM | POA: Insufficient documentation

## 2021-01-01 DIAGNOSIS — R1013 Epigastric pain: Secondary | ICD-10-CM | POA: Insufficient documentation

## 2021-01-01 MED ORDER — ONDANSETRON 4 MG PO TBDP
4.0000 mg | ORAL_TABLET | Freq: Once | ORAL | Status: AC
  Administered 2021-01-01: 4 mg via ORAL
  Filled 2021-01-01: qty 1

## 2021-01-01 NOTE — ED Notes (Signed)
Dr. Judd Lien to triage to see patient.

## 2021-01-01 NOTE — Discharge Instructions (Addendum)
Fill the prescriptions you were given yesterday.  Drink plenty of fluids and get plenty of rest.  Follow-up with your primary doctor if not improving in the next week.

## 2021-01-01 NOTE — ED Triage Notes (Signed)
Pt returns c/o abdominal pain. Seen here yesterday. Normal CT. Pt has a drink in hand. States he did leave yesterday but came back due to ongoing pain. Pt reports he ate [employer] last night, everything stayed down. Pt reports indigestion, burping with odor. No distress. Pt yawning, has multiple things with him question malingering. No fever, vomiting, diarrhea. VSS.

## 2021-01-01 NOTE — ED Provider Notes (Signed)
[facility] EMERGENCY DEPARTMENT Provider Note   CSN: [id number] Arrival date & time: 01/01/21  0516     History Chief Complaint  Patient presents with  . Abdominal Pain    Donald Hurst is a 30 y.o. male.  Patient is a 30 year old male with history of homelessness and migraines.  He presents with complaints of abdominal pain.  Patient was seen less than 24 hours ago with similar complaints.  He underwent a CT scan and laboratory studies, all of which are unremarkable.  He was prescribed Zofran, but did not fill this prescription.  Patient apparently never left the emergency department after he was discharged and has been staying in our waiting room.  He signs and again stating that he continues to feel poorly.  He tells me his belches are malodorous.  The history is provided by the patient.  Abdominal Pain Pain location:  Epigastric Pain quality: cramping   Pain radiates to:  Does not radiate Pain severity:  Moderate Timing:  Constant      Past Medical History:  Diagnosis Date  . Foreign body of right thumb 11/2018   glass  . History of MRSA infection    as a teenager - knee  . Homeless   . Migraines   . Stuffy and runny nose 11/23/2018   green drainage from nose, per pt.    There are no problems to display for this patient.   Past Surgical History:  Procedure Laterality Date  . FOREIGN BODY REMOVAL Right 11/29/2018   Procedure: RIGHT THUMB REMOVAL FOREIGN BODY MASS;  Surgeon: Betha Loa, MD;  Location: [facility];  Service: Orthopedics;  Laterality: Right;  . ORIF ANKLE FRACTURE Right   . SHOULDER ARTHROSCOPY W/ LABRAL REPAIR Right   . TONSILLECTOMY AND ADENOIDECTOMY  05/27/2001       No family history on file.  Social History   Tobacco Use  . Smoking status: Former Smoker    Packs/day: 0.25    Types: Cigarettes, Cigars  . Smokeless tobacco: Never Used  . Tobacco comment: smokes cigarettes and cigars daily  Vaping  Use  . Vaping Use: Never used  Substance Use Topics  . Alcohol use: Not Currently    Comment: weekends  . Drug use: Not Currently    Frequency: 2.0 times per week    Types: Marijuana    Home Medications Prior to Admission medications   Medication Sig Start Date End Date Taking? Authorizing Provider  cephALEXin (KEFLEX) 500 MG capsule Take 1 capsule (500 mg total) by mouth 4 (four) times daily. 08/03/20   Cathren Laine, MD  dicyclomine (BENTYL) 20 MG tablet Take 1 tablet (20 mg total) by mouth 2 (two) times daily. 12/31/20   Carroll Sage, PA-C  HYDROcodone-acetaminophen (NORCO/VICODIN) 5-325 MG tablet Take 1 tablet by mouth every 6 (six) hours as needed for severe pain. 08/18/20   Walisiewicz, Yvonna Alanis E, PA-C  ibuprofen (ADVIL) 800 MG tablet Take 1 tablet (800 mg total) by mouth 3 (three) times daily. 10/02/20   [name], MD  ondansetron (ZOFRAN) 4 MG tablet Take 1 tablet (4 mg total) by mouth every 8 (eight) hours as needed for nausea or vomiting. 12/31/20   Carroll Sage, PA-C  tiZANidine (ZANAFLEX) 4 MG tablet Take 1-2 tablets (4-8 mg total) by mouth every 6 (six) hours as needed for muscle spasms. 10/02/20   [name], MD    Allergies    Shrimp [shellfish allergy], Tramadol, and  Tramadol  Review of Systems   Review of Systems  Gastrointestinal: Positive for abdominal pain.  All other systems reviewed and are negative.   Physical Exam Updated Vital Signs BP (!) 149/82 (BP Location: Right Arm)   Pulse 75   Resp 18   SpO2 100%   Physical Exam Vitals and nursing note reviewed.  Constitutional:      General: He is not in acute distress.    Appearance: He is well-developed and well-nourished. He is not diaphoretic.  HENT:     Head: Normocephalic and atraumatic.     Mouth/Throat:     Mouth: Oropharynx is clear and moist.  Cardiovascular:     Rate and Rhythm: Normal rate and regular rhythm.     Heart sounds: No murmur heard. No friction rub.   Pulmonary:     Effort: Pulmonary effort is normal. No respiratory distress.     Breath sounds: Normal breath sounds. No wheezing or rales.  Abdominal:     General: Bowel sounds are normal. There is no distension.     Palpations: Abdomen is soft.     Tenderness: There is abdominal tenderness in the epigastric area. There is no right CVA tenderness, left CVA tenderness, guarding or rebound.  Musculoskeletal:        General: No edema. Normal range of motion.     Cervical back: Normal range of motion and neck supple.  Skin:    General: Skin is warm and dry.  Neurological:     Mental Status: He is alert and oriented to person, place, and time.     Coordination: Coordination normal.     ED Results / Procedures / Treatments   Labs (all labs ordered are listed, but only abnormal results are displayed) Labs Reviewed - No data to display  EKG EKG Interpretation  Date/Time:  Tuesday January 01 2021 05:21:53 EST Ventricular Rate:  80 PR Interval:  104 QRS Duration: 90 QT Interval:  378 QTC Calculation: 435 R Axis:   97 Text Interpretation: Sinus rhythm with sinus arrhythmia with short PR Rightward axis Borderline ECG When compared with ECG of 08/16/2020, No significant change was found Confirmed by Dione Booze ([id number]) on 01/01/2021 5:26:45 AM   Radiology CT ABDOMEN PELVIS W CONTRAST  Result Date: 12/31/2020 CLINICAL DATA:  Abdominal distension. EXAM: CT ABDOMEN AND PELVIS WITH CONTRAST TECHNIQUE: Multidetector CT imaging of the abdomen and pelvis was performed using the standard protocol following bolus administration of intravenous contrast. CONTRAST:  OMNIPAQUE IOHEXOL 300 MG/ML  SOLN COMPARISON:  CT 08/03/2020 FINDINGS: Lower chest: Lung bases are clear. Hepatobiliary: No focal liver abnormality. Decompressed gallbladder. No calcified gallstone. No pericholecystic fat stranding. There is no biliary dilatation. Pancreas: Unremarkable. No pancreatic ductal dilatation or surrounding  inflammatory changes. Spleen: Normal in size without focal abnormality. Adrenals/Urinary Tract: Normal adrenal glands. No hydronephrosis or perinephric edema. Homogeneous renal enhancement. Urinary bladder is physiologically distended without wall thickening. Stomach/Bowel: The stomach is distended with fluid and ingested contents. Normal positioning of the duodenum and ligament of Treitz. Small bowel occasionally fluid-filled but nondilated. There is no small bowel inflammation or obstruction. Fecalized distal small bowel contents. No terminal ileal inflammation. Normal appendix (series 3, image 83). Small to moderate volume of colonic stool. The descending colon is tortuous. There is no colonic wall thickening or pericolonic inflammation. Vascular/Lymphatic: Normal caliber abdominal aorta. Patent portal vein. No acute vascular findings. There prominent ileocolic lymph nodes, all subcentimeter. There retroperitoneal or upper abdominal adenopathy. Reproductive: Prostate is unremarkable.  Other: No free air or free fluid. No focal fluid collection. Tiny fat containing umbilical hernia. Musculoskeletal: Endplate spurring at L3-L4. There are no acute or suspicious osseous abnormalities. IMPRESSION: 1. Prominent ileocolic lymph nodes, can be seen with mesenteric adenitis. 2. Mild gastric distension with fluid and ingested material, but no wall thickening. There is also fecalization of distal small bowel contents. Overall findings suggest slow transit. There is no bowel inflammation or obstruction. Electronically Signed   By: Narda Rutherford M.D.   On: 12/31/2020 16:21    Procedures Procedures (including critical care time)  Medications Ordered in ED Medications  ondansetron (ZOFRAN-ODT) disintegrating tablet 4 mg (has no administration in time range)    ED Course  I have reviewed the triage vital signs and the nursing notes.  Pertinent labs & imaging results that were available during my care of the  patient were reviewed by me and considered in my medical decision making (see chart for details).    MDM Rules/Calculators/A&P  Patient presenting here for the second day in a row with complaints of abdominal discomfort.  He was seen yesterday and had extensive work-up.  Patient was discharged, but has been apparently lingering in the waiting room ever since.  He signs back in requesting reevaluation.  I have reviewed the patient's record from yesterday and also examined his abdomen.  I do not feel as though any further work-up is indicated.  I have advised the patient to take the medications prescribed to him yesterday and feel he is appropriate for discharge.  Final Clinical Impression(s) / ED Diagnoses Final diagnoses:  None    Rx / DC Orders ED Discharge Orders    None       Geoffery Lyons, MD 01/01/21 [phone]

## 2021-01-05 ENCOUNTER — Encounter (HOSPITAL_COMMUNITY): Payer: Self-pay | Admitting: *Deleted

## 2021-01-05 ENCOUNTER — Other Ambulatory Visit: Payer: Self-pay

## 2021-01-05 ENCOUNTER — Emergency Department (HOSPITAL_COMMUNITY): Payer: Self-pay

## 2021-01-05 ENCOUNTER — Emergency Department (HOSPITAL_COMMUNITY)
Admission: EM | Admit: 2021-01-05 | Discharge: 2021-01-05 | Disposition: A | Discharge status: Home / Self Care | Payer: Self-pay | Attending: Emergency Medicine | Admitting: Emergency Medicine

## 2021-01-05 DIAGNOSIS — Z87891 Personal history of nicotine dependence: Secondary | ICD-10-CM | POA: Insufficient documentation

## 2021-01-05 DIAGNOSIS — R110 Nausea: Secondary | ICD-10-CM | POA: Insufficient documentation

## 2021-01-05 DIAGNOSIS — M25571 Pain in right ankle and joints of right foot: Secondary | ICD-10-CM | POA: Insufficient documentation

## 2021-01-05 DIAGNOSIS — R519 Headache, unspecified: Secondary | ICD-10-CM | POA: Insufficient documentation

## 2021-01-05 DIAGNOSIS — M79671 Pain in right foot: Secondary | ICD-10-CM | POA: Insufficient documentation

## 2021-01-05 MED ORDER — KETOROLAC TROMETHAMINE 30 MG/ML IJ SOLN
30.0000 mg | Freq: Once | INTRAMUSCULAR | Status: AC
  Administered 2021-01-05: 30 mg via INTRAMUSCULAR
  Filled 2021-01-05: qty 1

## 2021-01-05 MED ORDER — DIPHENHYDRAMINE HCL 25 MG PO CAPS
50.0000 mg | ORAL_CAPSULE | Freq: Once | ORAL | Status: AC
  Administered 2021-01-05: 50 mg via ORAL
  Filled 2021-01-05: qty 2

## 2021-01-05 MED ORDER — PROCHLORPERAZINE MALEATE 5 MG PO TABS
10.0000 mg | ORAL_TABLET | Freq: Once | ORAL | Status: AC
  Administered 2021-01-05: 10 mg via ORAL
  Filled 2021-01-05: qty 2

## 2021-01-05 NOTE — ED Notes (Signed)
Patient transported to X-ray 
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
## 2021-01-05 NOTE — ED Provider Notes (Signed)
[facility] Provider Note   CSN: [id number] Arrival date & time: 01/05/21  0424     History Chief Complaint  Patient presents with  . Headache    [facility] is a 30 y.o. male with PMHx homelessness, migraines who presents to the ED via EMS with multiple complaints. Pt currently complaints of a gradual onset, constant, achy, diffuse headache and nausea. He reports this is consistent with typical migraine headaches. He has not taken anything for his symptoms. Denies fevers, chills, photophobia, phonophobia, confusion, unilateral weakness or numbness, rash, or any other associated symptoms.   Pt also complains of worsening R ankle pain for the past couple of days. He is unsure if he recently reinjured his ankle. Pt reports that he had surgery on the ankle in 2016 and from time to time it will hurt and begin swelling. No calf pain. No recent trauma to the leg. No chest pain or SOB.   The history is provided by the patient and medical records.       Past Medical History:  Diagnosis Date  . Foreign body of right thumb 11/2018   glass  . History of MRSA infection    as a teenager - knee  . Homeless   . Migraines   . Stuffy and runny nose 11/23/2018   green drainage from nose, per pt.    There are no problems to display for this patient.   Past Surgical History:  Procedure Laterality Date  . FOREIGN BODY REMOVAL Right 11/29/2018   Procedure: RIGHT THUMB REMOVAL FOREIGN BODY MASS;  Surgeon: Betha Loa, MD;  Location: [facility];  Service: Orthopedics;  Laterality: Right;  . ORIF ANKLE FRACTURE Right   . SHOULDER ARTHROSCOPY W/ LABRAL REPAIR Right   . TONSILLECTOMY AND ADENOIDECTOMY  05/27/2001       No family history on file.  Social History   Tobacco Use  . Smoking status: Former Smoker    Packs/day: 0.25    Types: Cigarettes, Cigars  . Smokeless tobacco: Never Used  . Tobacco comment: smokes cigarettes and  cigars daily  Vaping Use  . Vaping Use: Never used  Substance Use Topics  . Alcohol use: Not Currently    Comment: weekends  . Drug use: Not Currently    Frequency: 2.0 times per week    Types: Marijuana    Home Medications Prior to Admission medications   Medication Sig Start Date End Date Taking? Authorizing Provider  cephALEXin (KEFLEX) 500 MG capsule Take 1 capsule (500 mg total) by mouth 4 (four) times daily. 08/03/20   Cathren Laine, MD  dicyclomine (BENTYL) 20 MG tablet Take 1 tablet (20 mg total) by mouth 2 (two) times daily. 12/31/20   Carroll Sage, PA-C  HYDROcodone-acetaminophen (NORCO/VICODIN) 5-325 MG tablet Take 1 tablet by mouth every 6 (six) hours as needed for severe pain. 08/18/20   Walisiewicz, Yvonna Alanis E, PA-C  ibuprofen (ADVIL) 800 MG tablet Take 1 tablet (800 mg total) by mouth 3 (three) times daily. 10/02/20   [name], MD  ondansetron (ZOFRAN) 4 MG tablet Take 1 tablet (4 mg total) by mouth every 8 (eight) hours as needed for nausea or vomiting. 12/31/20   Carroll Sage, PA-C  tiZANidine (ZANAFLEX) 4 MG tablet Take 1-2 tablets (4-8 mg total) by mouth every 6 (six) hours as needed for muscle spasms. 10/02/20   [name], MD    Allergies    Shrimp [shellfish allergy],  Tramadol, and Tramadol  Review of Systems   Review of Systems  Constitutional: Negative for chills and fever.  Eyes: Negative for visual disturbance.  Respiratory: Negative for shortness of breath.   Cardiovascular: Negative for chest pain.  Gastrointestinal: Positive for nausea. Negative for vomiting.  Musculoskeletal: Positive for arthralgias. Negative for neck pain and neck stiffness.  Skin: Negative for rash.  Neurological: Positive for headaches. Negative for weakness and numbness.    Physical Exam Updated Vital Signs BP 128/81 (BP Location: Right Arm)   Pulse 74   Temp 97.7 F (36.5 C) (Oral)   Resp 16   SpO2 97%   Physical Exam Vitals and nursing note  reviewed.  Constitutional:      Appearance: He is not ill-appearing.  HENT:     Head: Normocephalic and atraumatic.  Eyes:     Extraocular Movements: Extraocular movements intact.     Conjunctiva/sclera: Conjunctivae normal.     Pupils: Pupils are equal, round, and reactive to light.  Cardiovascular:     Rate and Rhythm: Normal rate and regular rhythm.  Pulmonary:     Effort: Pulmonary effort is normal.     Breath sounds: Normal breath sounds. No wheezing, rhonchi or rales.  Musculoskeletal:     Comments: Very minimal R foot swelling along the dorsal aspect. + TTP to the R great toe and lateral malleolus. ROM intact to toes and ankle. 2+ DP pulse. No calf tenderness to palpation. No lower leg swelling compared to LLE. Strength and sensation intact.   Skin:    General: Skin is warm and dry.     Coloration: Skin is not jaundiced.  Neurological:     Mental Status: He is alert.     Comments: Alert and oriented to self, place, time and event.   Speech is fluent, clear without dysarthria or dysphasia.   Strength 5/5 in upper/lower extremities  Sensation intact in upper/lower extremities   Normal gait.  Negative Romberg. No pronator drift.  Normal finger-to-nose and feet tapping.  CN I not tested  CN II grossly intact visual fields bilaterally. Did not visualize posterior eye.   CN III, IV, VI PERRLA and EOMs intact bilaterally  CN V Intact sensation to sharp and light touch to the face  CN VII facial movements symmetric  CN VIII not tested  CN IX, X no uvula deviation, symmetric rise of soft palate  CN XI 5/5 SCM and trapezius strength bilaterally  CN XII Midline tongue protrusion, symmetric L/R movements      ED Results / Procedures / Treatments   Labs (all labs ordered are listed, but only abnormal results are displayed) Labs Reviewed - No data to display  EKG None  Radiology DG Ankle Complete Right  Result Date: 01/05/2021 CLINICAL DATA:  Right great toe and ankle  pain for the past 4 weeks after landing awkwardly while jumping over a fence. EXAM: RIGHT ANKLE - COMPLETE 3+ VIEW COMPARISON:  04/29/2016; right foot radiographs-earlier same day FINDINGS: No definite acute fracture or dislocation. Redemonstrated sequela of previous sideplate fixation of the distal fibula. The syndesmotic screws transfixing the distal tib-fib joint were noted to be fractured on remote examination performed in 2017 however there has been development of an additional fracture involving the more cranially located screw as well as further displacement about the previous cancellous screw fracture sites. Joint spaces appear preserved. The ankle mortise appears preserved. Small ankle joint effusion. Regional soft tissues appear normal. No radiopaque foreign body. Tiny plantar calcaneal  spur. IMPRESSION: 1. Age-indeterminate fracture of the more cranially located distal tib-fib syndesmotic screw with interval displacement of remote fractured cancellous screws. In the absence of more recent imaging, an acute on chronic process is not excluded. 2. Small ankle joint effusion. Otherwise, no definite acute findings. Electronically Signed   By: Simonne Come M.D.   On: 01/05/2021 12:33   DG Foot Complete Right  Result Date: 01/05/2021 CLINICAL DATA:  Right great toe and ankle pain for the past 4 weeks. EXAM: RIGHT FOOT COMPLETE - 3+ VIEW COMPARISON:  Right ankle radiographs-earlier same day; 04/29/2016; right great toe radiographs-11/30/2020 FINDINGS: Fractured distal tib-fib syndesmotic cancellous screws as better demonstrated on preceding ankle radiographs. Potential mild soft tissue swelling about the forefoot without associated no fracture or dislocation. Redemonstrated mild hallux valgus deformity with mild degenerative change of the first MTP joint with joint space loss, subchondral sclerosis osteophytosis. No discrete erosions. Remaining joint spaces appear preserved. IMPRESSION: 1. Mild soft tissue  swelling about the forefoot without associated fracture or dislocation. 2. Mild hallux valgus deformity with mild degenerative change of the first MTP joint. Electronically Signed   By: Simonne Come M.D.   On: 01/05/2021 12:37    Procedures Procedures (including critical care time)  Medications Ordered in ED Medications  diphenhydrAMINE (BENADRYL) capsule 50 mg (50 mg Oral Given 01/05/21 1223)  prochlorperazine (COMPAZINE) tablet 10 mg (10 mg Oral Given 01/05/21 1223)  ketorolac (TORADOL) 30 MG/ML injection 30 mg (30 mg Intramuscular Given 01/05/21 1225)    ED Course  I have reviewed the triage vital signs and the nursing notes.  Pertinent labs & imaging results that were available during my care of the patient were reviewed by me and considered in my medical decision making (see chart for details).    MDM Rules/Calculators/A&P                          30 year old male presents to the ED today with complaint of migraine headache and R ankle pain. Pt has history of migraine headaches and states this feels similar. Has had ankle surgery in 2016 and states from time to time will get pain. No recent injury. On arrival to the ED vitals are stable. Pt appears to be in NAD. Visualized patient ambulating without difficulty on right foot. He does have some TTP and swelling along the dorsal aspect of the foot. No calf TTP or swelling. Low suspicion for DVT at this time. Will plan for xrays however does appear pt has chronic pain in this area. Will also provide headache cocktail and reassess. No focal neuro deficits or meningeal signs on exam today.   Xrays without acute findings at this time.   On reevaluation pt resting comfortably and sleeping soundly. There is concern for some malingering given homelessness and 14 ED visits in the past 6 months. Do not feel pt needs additional workup at this time given his overall well appearance. Will discharge at this time. [facility] information  provided.   This note was prepared using Dragon voice recognition software and may include unintentional dictation errors due to the inherent limitations of voice recognition software.  Final Clinical Impression(s) / ED Diagnoses Final diagnoses:  Bad headache  Right foot pain    Rx / DC Orders ED Discharge Orders    None       Discharge Instructions     Please follow up with your PCP regarding your ED  visit. If you do not have one you can follow up with [facility] for primary care needs.         Tanda RockersVenter, Yuri Fana, PA-C 01/05/21 1356    Pollyann SavoySheldon, Charles B, MD 01/06/21 [phone]

## 2021-01-05 NOTE — Discharge Instructions (Signed)
Please follow up with your PCP regarding your ED visit. If you do not have one you can follow up with [facility] for primary care needs.

## 2021-01-05 NOTE — ED Triage Notes (Signed)
Pt reporting he has had a headache and nausea. Also having lower back pain. C/o right ankle "throbbing" and swelling. States he has had ankle surgery previously and it is always swollen, just having increased pain now.

## 2021-01-24 ENCOUNTER — Emergency Department (HOSPITAL_COMMUNITY)
Admission: EM | Admit: 2021-01-24 | Discharge: 2021-01-25 | Disposition: A | Discharge status: Home / Self Care | Payer: Self-pay | Attending: Emergency Medicine | Admitting: Emergency Medicine

## 2021-01-24 ENCOUNTER — Encounter (HOSPITAL_COMMUNITY): Payer: Self-pay | Admitting: Emergency Medicine

## 2021-01-24 ENCOUNTER — Other Ambulatory Visit: Payer: Self-pay

## 2021-01-24 ENCOUNTER — Emergency Department (HOSPITAL_COMMUNITY)
Admission: EM | Admit: 2021-01-24 | Discharge: 2021-01-24 | Disposition: A | Discharge status: Home / Self Care | Payer: Self-pay | Attending: Emergency Medicine | Admitting: Emergency Medicine

## 2021-01-24 ENCOUNTER — Emergency Department (HOSPITAL_COMMUNITY): Payer: Self-pay

## 2021-01-24 ENCOUNTER — Encounter (HOSPITAL_COMMUNITY): Payer: Self-pay | Admitting: *Deleted

## 2021-01-24 DIAGNOSIS — R112 Nausea with vomiting, unspecified: Secondary | ICD-10-CM | POA: Insufficient documentation

## 2021-01-24 DIAGNOSIS — Z87891 Personal history of nicotine dependence: Secondary | ICD-10-CM | POA: Insufficient documentation

## 2021-01-24 DIAGNOSIS — R519 Headache, unspecified: Secondary | ICD-10-CM | POA: Insufficient documentation

## 2021-01-24 DIAGNOSIS — R1084 Generalized abdominal pain: Secondary | ICD-10-CM | POA: Insufficient documentation

## 2021-01-24 DIAGNOSIS — R55 Syncope and collapse: Secondary | ICD-10-CM | POA: Insufficient documentation

## 2021-01-24 MED ORDER — PROCHLORPERAZINE EDISYLATE 10 MG/2ML IJ SOLN
10.0000 mg | Freq: Once | INTRAMUSCULAR | Status: AC
  Administered 2021-01-24: 10 mg via INTRAVENOUS
  Filled 2021-01-24: qty 2

## 2021-01-24 MED ORDER — SODIUM CHLORIDE 0.9 % IV BOLUS
500.0000 mL | Freq: Once | INTRAVENOUS | Status: AC
  Administered 2021-01-24: 500 mL via INTRAVENOUS

## 2021-01-24 MED ORDER — DIPHENHYDRAMINE HCL 50 MG/ML IJ SOLN
25.0000 mg | Freq: Once | INTRAMUSCULAR | Status: AC
  Administered 2021-01-24: 25 mg via INTRAVENOUS
  Filled 2021-01-24: qty 1

## 2021-01-24 NOTE — ED Notes (Signed)
Patient verbalizes understanding of discharge instructions. Opportunity for questioning and answers were provided. Armband removed by staff, pt discharged from ED.  

## 2021-01-24 NOTE — ED Triage Notes (Incomplete)
The pt arrived by [address] from the [address] the pt is homeless he frequents the ed almost every night   tonighthe is c/o headache and not feeling well since 1200 midnight

## 2021-01-24 NOTE — ED Provider Notes (Signed)
[facility] EMERGENCY DEPARTMENT Provider Note   CSN: [id number] Arrival date & time: 01/24/21  5784     History No chief complaint on file.   [facility] is a 30 y.o. male.  HPI Patient is a 30 year old male with history of migraines, homelessness, who presents the emergency department due to a syncopal episode.  Patient states he began experiencing a migraine earlier tonight.  States he is also been smoking marijuana but denies any alcohol use or other drug use.  States that he became lightheaded while standing and had a brief near syncopal episode in which he fell to the floor.  Unsure of head trauma.  States that it only "lasted a second".  Reports some current nausea as well as a continuation of his headache but otherwise has no complaints.  No chest pain or shortness of breath.    Past Medical History:  Diagnosis Date  . Foreign body of right thumb 11/2018   glass  . History of MRSA infection    as a teenager - knee  . Homeless   . Migraines   . Stuffy and runny nose 11/23/2018   green drainage from nose, per pt.    There are no problems to display for this patient.   Past Surgical History:  Procedure Laterality Date  . FOREIGN BODY REMOVAL Right 11/29/2018   Procedure: RIGHT THUMB REMOVAL FOREIGN BODY MASS;  Surgeon: Betha Loa, MD;  Location: [facility];  Service: Orthopedics;  Laterality: Right;  . ORIF ANKLE FRACTURE Right   . SHOULDER ARTHROSCOPY W/ LABRAL REPAIR Right   . TONSILLECTOMY AND ADENOIDECTOMY  05/27/2001       No family history on file.  Social History   Tobacco Use  . Smoking status: Former Smoker    Packs/day: 0.25    Types: Cigarettes, Cigars  . Smokeless tobacco: Never Used  . Tobacco comment: smokes cigarettes and cigars daily  Vaping Use  . Vaping Use: Never used  Substance Use Topics  . Alcohol use: Not Currently    Comment: weekends  . Drug use: Not Currently    Frequency: 2.0 times per week     Types: Marijuana    Home Medications Prior to Admission medications   Medication Sig Start Date End Date Taking? Authorizing Provider  cephALEXin (KEFLEX) 500 MG capsule Take 1 capsule (500 mg total) by mouth 4 (four) times daily. 08/03/20   Cathren Laine, MD  dicyclomine (BENTYL) 20 MG tablet Take 1 tablet (20 mg total) by mouth 2 (two) times daily. 12/31/20   Carroll Sage, PA-C  HYDROcodone-acetaminophen (NORCO/VICODIN) 5-325 MG tablet Take 1 tablet by mouth every 6 (six) hours as needed for severe pain. 08/18/20   Walisiewicz, Yvonna Alanis E, PA-C  ibuprofen (ADVIL) 800 MG tablet Take 1 tablet (800 mg total) by mouth 3 (three) times daily. 10/02/20   [name], MD  ondansetron (ZOFRAN) 4 MG tablet Take 1 tablet (4 mg total) by mouth every 8 (eight) hours as needed for nausea or vomiting. 12/31/20   Carroll Sage, PA-C  tiZANidine (ZANAFLEX) 4 MG tablet Take 1-2 tablets (4-8 mg total) by mouth every 6 (six) hours as needed for muscle spasms. 10/02/20   [name], MD    Allergies    Shrimp [shellfish allergy], Tramadol, and Tramadol  Review of Systems   Review of Systems  All other systems reviewed and are negative. Ten systems reviewed and are negative for acute change, except as  noted in the HPI.   Physical Exam Updated Vital Signs BP 139/87 (BP Location: Right Arm)   Pulse 82   Temp 98.2 F (36.8 C) (Oral)   Resp 18   Ht 6\' 3"  (1.905 m)   Wt 125 kg   SpO2 98%   BMI 34.44 kg/m   Physical Exam Vitals and nursing note reviewed.  Constitutional:      General: He is not in acute distress.    Appearance: Normal appearance. He is not ill-appearing, toxic-appearing or diaphoretic.  HENT:     Head: Normocephalic and atraumatic.     Right Ear: External ear normal.     Left Ear: External ear normal.     Nose: Nose normal.     Mouth/Throat:     Mouth: Mucous membranes are moist.     Pharynx: Oropharynx is clear. No oropharyngeal exudate or posterior  oropharyngeal erythema.  Eyes:     Extraocular Movements: Extraocular movements intact.  Cardiovascular:     Rate and Rhythm: Normal rate and regular rhythm.     Pulses: Normal pulses.     Heart sounds: Normal heart sounds. No murmur heard. No friction rub. No gallop.   Pulmonary:     Effort: Pulmonary effort is normal. No respiratory distress.     Breath sounds: Normal breath sounds. No stridor. No wheezing, rhonchi or rales.  Abdominal:     General: Abdomen is flat.     Tenderness: There is no abdominal tenderness.  Musculoskeletal:        General: Normal range of motion.     Cervical back: Normal range of motion and neck supple. No tenderness.  Skin:    General: Skin is warm and dry.  Neurological:     General: No focal deficit present.     Mental Status: He is alert and oriented to person, place, and time.     Comments: Patient is oriented to person, place, and time. Patient phonates in clear, complete, and coherent sentences. Strength is 5/5 in all four extremities. Distal sensation intact in all four extremities.   Psychiatric:        Mood and Affect: Mood normal.        Behavior: Behavior normal.    ED Results / Procedures / Treatments   Labs (all labs ordered are listed, but only abnormal results are displayed) Labs Reviewed - No data to display  EKG EKG Interpretation  Date/Time:  Thursday January 24 2021 11:33:34 EST Ventricular Rate:  83 PR Interval:  96 QRS Duration: 114 QT Interval:  402 QTC Calculation: 472 R Axis:   104 Text Interpretation: Sinus rhythm with short PR Lateral infarct , age undetermined Inferior infarct , age undetermined Abnormal ECG no sig change from previous Confirmed by 1973 [phone]) on 01/24/2021 11:38:17 AM  Radiology CT Head Wo Contrast  Result Date: 01/24/2021 CLINICAL DATA:  Syncope. EXAM: CT HEAD WITHOUT CONTRAST TECHNIQUE: Contiguous axial images were obtained from the base of the skull through the vertex without  intravenous contrast. COMPARISON:  CT head August 03, 2020. FINDINGS: Brain: No evidence of acute infarction, hemorrhage, hydrocephalus, extra-axial collection or mass lesion/mass effect. Vascular: No hyperdense vessel identified. Skull: No acute fracture. Sinuses/Orbits: Scattered ethmoid air cell mucosal thickening without air-fluid level. Unremarkable orbits. Other: No mastoid effusions. IMPRESSION: No evidence of acute intracranial abnormality. Electronically Signed   By: August 05, 2020 MD   On: 01/24/2021 08:19    Procedures Procedures   Medications Ordered in ED Medications  prochlorperazine (COMPAZINE) injection 10 mg (10 mg Intravenous Given 01/24/21 0839)  diphenhydrAMINE (BENADRYL) injection 25 mg (25 mg Intravenous Given 01/24/21 0839)  sodium chloride 0.9 % bolus 500 mL (500 mLs Intravenous New Bag/Given 01/24/21 0300)   ED Course  I have reviewed the triage vital signs and the nursing notes.  Pertinent labs & imaging results that were available during my care of the patient were reviewed by me and considered in my medical decision making (see chart for details).  Clinical Course as of 01/24/21 1104  Thu Jan 24, 2021  9233 CT Head Wo Contrast No evidence of acute intracranial abnormalities. [LJ]    Clinical Course User Index [LJ] Placido Sou, PA-C   MDM Rules/Calculators/A&P                          Pt is a 30 y.o. male who presents the emergency department due to a headache as well as a brief near syncopal episode.  Imaging: CT scan of the head is negative.  ECG: Sinus rhythm with a short PR interval.  No significant change from prior.  I, Placido Sou, PA-C, personally reviewed and evaluated these images and lab results as part of my medical decision-making.  Patient had a benign neurological exam.  Reassuring physical exam.  CT scan of the head is negative.  Patient is ambulatory.  Reports history of migraines and is currently experiencing a migraine.   Patient given a migraine cocktail which provided relief.  Reassuring ECG.  Patient states he has been smoking marijuana.  Feel that he is stable for discharge at this time.  He is agreeable.  His questions were answered and he was amicable at the time of discharge.  Note: Portions of this report may have been transcribed using voice recognition software. Every effort was made to ensure accuracy; however, inadvertent computerized transcription errors may be present.   Final Clinical Impression(s) / ED Diagnoses Final diagnoses:  Bad headache  Near syncope    Rx / DC Orders ED Discharge Orders    None       Placido Sou, PA-C 01/24/21 1139    Arby Barrette, MD 01/29/21 1615

## 2021-01-24 NOTE — Discharge Instructions (Signed)
Please return to the ER with any new or worsening symptoms.  It is a pleasure to meet you.

## 2021-01-24 NOTE — ED Triage Notes (Signed)
Patient reports generalized abdominal pain with nausea and emesis today , denies diarrhea or fever .

## 2021-01-25 ENCOUNTER — Emergency Department (HOSPITAL_COMMUNITY): Payer: Self-pay

## 2021-01-25 LAB — COMPREHENSIVE METABOLIC PANEL
ALT: 144 U/L — ABNORMAL HIGH (ref 0–44)
AST: 152 U/L — ABNORMAL HIGH (ref 15–41)
Albumin: 3.3 g/dL — ABNORMAL LOW (ref 3.5–5.0)
Alkaline Phosphatase: 92 U/L (ref 38–126)
Anion gap: 9 (ref 5–15)
BUN: 15 mg/dL (ref 6–20)
CO2: 24 mmol/L (ref 22–32)
Calcium: 8.8 mg/dL — ABNORMAL LOW (ref 8.9–10.3)
Chloride: 108 mmol/L (ref 98–111)
Creatinine, Ser: 1.23 mg/dL (ref 0.61–1.24)
GFR, Estimated: >60 mL/min (ref >60)
Glucose, Bld: 94 mg/dL (ref 70–99)
Potassium: 3.9 mmol/L (ref 3.5–5.1)
Sodium: 141 mmol/L (ref 135–145)
Total Bilirubin: 0.6 mg/dL (ref 0.3–1.2)
Total Protein: 6.6 g/dL (ref 6.5–8.1)

## 2021-01-25 LAB — URINALYSIS, ROUTINE W REFLEX MICROSCOPIC
Bacteria, UA: NONE SEEN
Bilirubin Urine: NEGATIVE
Glucose, UA: NEGATIVE mg/dL
Hgb urine dipstick: NEGATIVE
Ketones, ur: NEGATIVE mg/dL
Nitrite: NEGATIVE
Protein, ur: NEGATIVE mg/dL
Specific Gravity, Urine: 1.024 (ref 1.005–1.030)
WBC, UA: >50 WBC/hpf — ABNORMAL HIGH (ref 0–5)
pH: 5 (ref 5.0–8.0)

## 2021-01-25 LAB — CBC
HCT: 41.9 % (ref 39.0–52.0)
Hemoglobin: 13.8 g/dL (ref 13.0–17.0)
MCH: 31.8 pg (ref 26.0–34.0)
MCHC: 32.9 g/dL (ref 30.0–36.0)
MCV: 96.5 fL (ref 80.0–100.0)
Platelets: 294 10*3/uL (ref 150–400)
RBC: 4.34 MIL/uL (ref 4.22–5.81)
RDW: 15.9 % — ABNORMAL HIGH (ref 11.5–15.5)
WBC: 5 10*3/uL (ref 4.0–10.5)
nRBC: 0 % (ref 0.0–0.2)

## 2021-01-25 LAB — LIPASE, BLOOD: Lipase: 163 U/L — ABNORMAL HIGH (ref 11–51)

## 2021-01-25 MED ORDER — ONDANSETRON HCL 4 MG/2ML IJ SOLN
4.0000 mg | Freq: Once | INTRAMUSCULAR | Status: AC
  Administered 2021-01-25: 4 mg via INTRAVENOUS
  Filled 2021-01-25: qty 2

## 2021-01-25 MED ORDER — CEFTRIAXONE SODIUM 500 MG IJ SOLR
500.0000 mg | Freq: Once | INTRAMUSCULAR | Status: AC
  Administered 2021-01-25: 500 mg via INTRAMUSCULAR
  Filled 2021-01-25: qty 500

## 2021-01-25 MED ORDER — CEFTRIAXONE SODIUM 500 MG IJ SOLR: 250.0000 mg | Freq: Once | INTRAMUSCULAR | Status: DC

## 2021-01-25 MED ORDER — ACETAMINOPHEN 500 MG PO TABS
1000.0000 mg | ORAL_TABLET | Freq: Once | ORAL | Status: AC
  Administered 2021-01-25: 1000 mg via ORAL
  Filled 2021-01-25: qty 2

## 2021-01-25 MED ORDER — KETOROLAC TROMETHAMINE 15 MG/ML IJ SOLN
15.0000 mg | Freq: Once | INTRAMUSCULAR | Status: AC
  Administered 2021-01-25: 15 mg via INTRAVENOUS
  Filled 2021-01-25: qty 1

## 2021-01-25 MED ORDER — SODIUM CHLORIDE 0.9 % IV BOLUS
1000.0000 mL | Freq: Once | INTRAVENOUS | Status: AC
  Administered 2021-01-25: 1000 mL via INTRAVENOUS

## 2021-01-25 MED ORDER — STERILE WATER FOR INJECTION IJ SOLN
INTRAMUSCULAR | Status: AC
  Filled 2021-01-25: qty 10

## 2021-01-25 MED ORDER — ONDANSETRON HCL 4 MG PO TABS: 4.0000 mg | ORAL_TABLET | Freq: Four times a day (QID) | ORAL | 0 refills | Status: AC

## 2021-01-25 MED ORDER — IOHEXOL 300 MG/ML  SOLN
100.0000 mL | Freq: Once | INTRAMUSCULAR | Status: AC
  Administered 2021-01-25: 100 mL via INTRAVENOUS

## 2021-01-25 MED ORDER — AZITHROMYCIN 1 G PO PACK
1.0000 g | PACK | Freq: Once | ORAL | Status: AC
  Administered 2021-01-25: 1 g via ORAL
  Filled 2021-01-25: qty 1

## 2021-01-25 NOTE — ED Provider Notes (Signed)
[facility] EMERGENCY DEPARTMENT Provider Note   CSN: [id number] Arrival date & time: 01/24/21  2336     History Chief Complaint  Patient presents with  . Abdominal Pain    Donald Hurst is a 30 y.o. male.  30 year old male with prior medical history as detailed below presents for evaluation of reported nausea, vomiting, and abdominal discomfort.  Patient reports onset of symptoms roughly 24 hours prior.  Patient denies fever.  Patient denies urinary changes or symptoms.  He denies bowel movement changes.  He denies bloody emesis.  He is not taking anything for his symptoms.  Of note, this patient is frequently at the [facility] health ED facilities for multiple diffuse complaints.  The history is provided by the patient and medical records.  Abdominal Pain Pain location:  Generalized Pain quality: aching   Pain radiates to:  Does not radiate Pain severity:  Mild Onset quality:  Gradual Duration:  24 hours Timing:  Constant Progression:  Worsening Chronicity:  New Relieved by:  Nothing Worsened by:  Nothing Ineffective treatments:  None tried      Past Medical History:  Diagnosis Date  . Foreign body of right thumb 11/2018   glass  . History of MRSA infection    as a teenager - knee  . Homeless   . Migraines   . Stuffy and runny nose 11/23/2018   green drainage from nose, per pt.    There are no problems to display for this patient.   Past Surgical History:  Procedure Laterality Date  . FOREIGN BODY REMOVAL Right 11/29/2018   Procedure: RIGHT THUMB REMOVAL FOREIGN BODY MASS;  Surgeon: Betha Loa, MD;  Location: [facility];  Service: Orthopedics;  Laterality: Right;  . ORIF ANKLE FRACTURE Right   . SHOULDER ARTHROSCOPY W/ LABRAL REPAIR Right   . TONSILLECTOMY AND ADENOIDECTOMY  05/27/2001       No family history on file.  Social History   Tobacco Use  . Smoking status: Former Smoker    Packs/day: 0.25    Types:  Cigarettes, Cigars  . Smokeless tobacco: Never Used  . Tobacco comment: smokes cigarettes and cigars daily  Vaping Use  . Vaping Use: Never used  Substance Use Topics  . Alcohol use: Not Currently    Comment: weekends  . Drug use: Not Currently    Frequency: 2.0 times per week    Types: Marijuana    Home Medications Prior to Admission medications   Medication Sig Start Date End Date Taking? Authorizing Provider  cephALEXin (KEFLEX) 500 MG capsule Take 1 capsule (500 mg total) by mouth 4 (four) times daily. 08/03/20   Cathren Laine, MD  dicyclomine (BENTYL) 20 MG tablet Take 1 tablet (20 mg total) by mouth 2 (two) times daily. 12/31/20   Carroll Sage, PA-C  HYDROcodone-acetaminophen (NORCO/VICODIN) 5-325 MG tablet Take 1 tablet by mouth every 6 (six) hours as needed for severe pain. 08/18/20   Walisiewicz, Yvonna Alanis E, PA-C  ibuprofen (ADVIL) 800 MG tablet Take 1 tablet (800 mg total) by mouth 3 (three) times daily. 10/02/20   [name], MD  ondansetron (ZOFRAN) 4 MG tablet Take 1 tablet (4 mg total) by mouth every 8 (eight) hours as needed for nausea or vomiting. 12/31/20   Carroll Sage, PA-C  tiZANidine (ZANAFLEX) 4 MG tablet Take 1-2 tablets (4-8 mg total) by mouth every 6 (six) hours as needed for muscle spasms. 10/02/20   [name], MD  Allergies    Shrimp [shellfish allergy], Tramadol, and Tramadol  Review of Systems   Review of Systems  Gastrointestinal: Positive for abdominal pain.  All other systems reviewed and are negative.   Physical Exam Updated Vital Signs BP 131/83   Pulse 76   Temp 98 F (36.7 C) (Oral)   Resp 19   SpO2 98%   Physical Exam Vitals and nursing note reviewed.  Constitutional:      General: He is not in acute distress.    Appearance: He is well-developed and well-nourished.  HENT:     Head: Normocephalic and atraumatic.     Mouth/Throat:     Mouth: Oropharynx is clear and moist.  Eyes:     Extraocular  Movements: EOM normal.     Conjunctiva/sclera: Conjunctivae normal.     Pupils: Pupils are equal, round, and reactive to light.  Cardiovascular:     Rate and Rhythm: Normal rate and regular rhythm.     Heart sounds: Normal heart sounds.  Pulmonary:     Effort: Pulmonary effort is normal. No respiratory distress.     Breath sounds: Normal breath sounds.  Abdominal:     General: There is no distension.     Palpations: Abdomen is soft.     Tenderness: There is no abdominal tenderness.  Musculoskeletal:        General: No deformity or edema. Normal range of motion.     Cervical back: Normal range of motion and neck supple.  Skin:    General: Skin is warm and dry.  Neurological:     Mental Status: He is alert and oriented to person, place, and time.  Psychiatric:        Mood and Affect: Mood and affect normal.     ED Results / Procedures / Treatments   Labs (all labs ordered are listed, but only abnormal results are displayed) Labs Reviewed  LIPASE, BLOOD - Abnormal; Notable for the following components:      Result Value   Lipase 163 (*)    All other components within normal limits  COMPREHENSIVE METABOLIC PANEL - Abnormal; Notable for the following components:   Calcium 8.8 (*)    Albumin 3.3 (*)    AST 152 (*)    ALT 144 (*)    All other components within normal limits  CBC - Abnormal; Notable for the following components:   RDW 15.9 (*)    All other components within normal limits  URINALYSIS, ROUTINE W REFLEX MICROSCOPIC - Abnormal; Notable for the following components:   Leukocytes,Ua MODERATE (*)    WBC, UA >50 (*)    All other components within normal limits    EKG None  Radiology CT Head Wo Contrast  Result Date: 01/24/2021 CLINICAL DATA:  Syncope. EXAM: CT HEAD WITHOUT CONTRAST TECHNIQUE: Contiguous axial images were obtained from the base of the skull through the vertex without intravenous contrast. COMPARISON:  CT head August 03, 2020. FINDINGS: Brain: No  evidence of acute infarction, hemorrhage, hydrocephalus, extra-axial collection or mass lesion/mass effect. Vascular: No hyperdense vessel identified. Skull: No acute fracture. Sinuses/Orbits: Scattered ethmoid air cell mucosal thickening without air-fluid level. Unremarkable orbits. Other: No mastoid effusions. IMPRESSION: No evidence of acute intracranial abnormality. Electronically Signed   By: Feliberto Harts MD   On: 01/24/2021 08:19   CT ABDOMEN PELVIS W CONTRAST  Result Date: 01/25/2021 CLINICAL DATA:  Acute nonlocalized abdominal pain, nausea, vomiting EXAM: CT ABDOMEN AND PELVIS WITH CONTRAST TECHNIQUE: Multidetector CT imaging  of the abdomen and pelvis was performed using the standard protocol following bolus administration of intravenous contrast. CONTRAST:  OMNIPAQUE IOHEXOL 300 MG/ML  SOLN COMPARISON:  12/31/2020 FINDINGS: Lower chest: The visualized lung bases are clear bilaterally. The visualized heart and pericardium are unremarkable. Hepatobiliary: No focal liver abnormality is seen. No gallstones, gallbladder wall thickening, or biliary dilatation. Pancreas: Unremarkable Spleen: Unremarkable Adrenals/Urinary Tract: Adrenal glands are unremarkable. Kidneys are normal, without renal calculi, focal lesion, or hydronephrosis. Bladder is unremarkable. Stomach/Bowel: Stomach is within normal limits. Appendix appears normal. No evidence of bowel wall thickening, distention, or inflammatory changes. No free intraperitoneal gas or fluid. Vascular/Lymphatic: No significant vascular findings are present. No enlarged abdominal or pelvic lymph nodes. Reproductive: Prostate is unremarkable. Other: Rectum unremarkable. Tiny fat containing umbilical hernia noted. Musculoskeletal: Degenerative changes are noted within the lumbar spine. No lytic or blastic bone lesions are seen. No acute bone abnormality. IMPRESSION: No acute intra-abdominal pathology identified. No radiographic explanation for the  patient's symptoms. Electronically Signed   By: Helyn Numbers MD   On: 01/25/2021 08:44    Procedures Procedures   Medications Ordered in ED Medications  sodium chloride 0.9 % bolus 1,000 mL (has no administration in time range)  ondansetron (ZOFRAN) injection 4 mg (has no administration in time range)  iohexol (OMNIPAQUE) 300 MG/ML solution 100 mL (has no administration in time range)    ED Course  I have reviewed the triage vital signs and the nursing notes.  Pertinent labs & imaging results that were available during my care of the patient were reviewed by me and considered in my medical decision making (see chart for details).    MDM Rules/Calculators/A&P                          MDM  Screen complete  Donald Hurst was evaluated in Emergency Department on 01/25/2021 for the symptoms described in the history of present illness. He was evaluated in the context of the global COVID-19 pandemic, which necessitated consideration that the patient might be at risk for infection with the SARS-CoV-2 virus that causes COVID-19. Institutional protocols and algorithms that pertain to the evaluation of patients at risk for COVID-19 are in a state of rapid change based on information released by regulatory bodies including the CDC and federal and state organizations. These policies and algorithms were followed during the patient's care in the ED.  Patient presented with complaint of diffuse abdominal discomfort and associated nausea and vomiting.  Patient's ED work-up is without significant findings.  CT abdomen pelvis is within normal limits without acute pathology.  After completion of 500 cc of IV fluids, patient now feels improved. Repeat abdominal exam is benign. He is taking PO well.  Patient understands need for close follow-up.  Strict return precautions given and understood.    Final Clinical Impression(s) / ED Diagnoses Final diagnoses:  Generalized abdominal pain    Rx /  DC Orders ED Discharge Orders         Ordered    ondansetron (ZOFRAN) 4 MG tablet  Every 6 hours        01/25/21 0853           [name], MD 01/25/21 [phone]

## 2021-01-25 NOTE — ED Notes (Signed)
Pt out of room at this time; pt in CT

## 2021-01-25 NOTE — Discharge Instructions (Addendum)
Please return for any problem.  °

## 2021-03-18 IMAGING — CT CT ABD-PELV W/ CM
2 of 4 series · 16 of 46 positions shown, 18 images · IV contrast (omnipaque)
Comparison: CT 08/03/2020

CLINICAL DATA: Abdominal distension.

EXAM:
CT ABDOMEN AND PELVIS WITH CONTRAST
TECHNIQUE: Multidetector CT imaging of the abdomen and pelvis was performed
using the standard protocol following bolus administration of
intravenous contrast.
CONTRAST:  100mL OMNIPAQUE IOHEXOL 300 MG/ML  SOLN

[Series 3: a/p w/ 5mm · axial · 0.98mm/px · z∈[+799,+1344]mm · 13 of 119 slices shown, 15 images]
[im 5/119  soft-tissue]
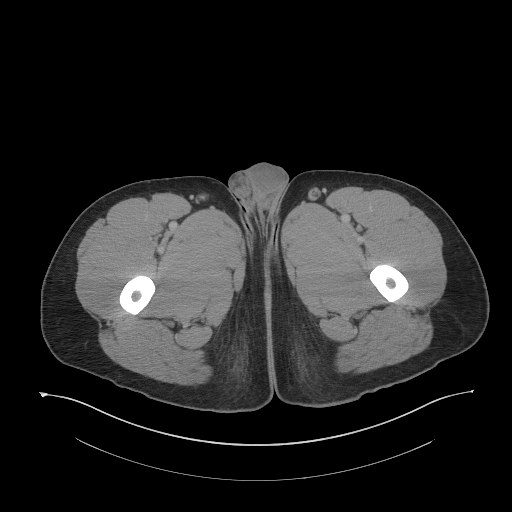
[im 5/119  bone]
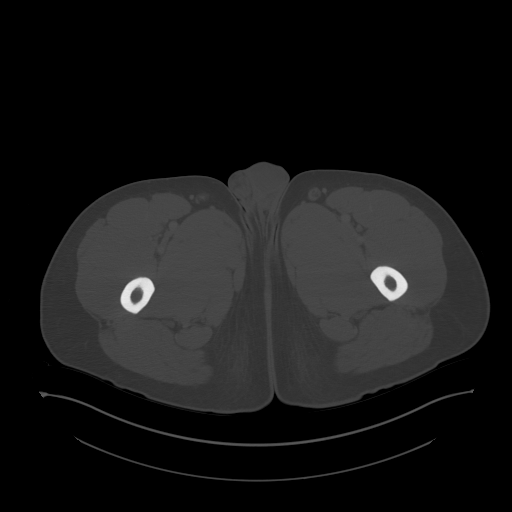
[im 15/119  soft-tissue]
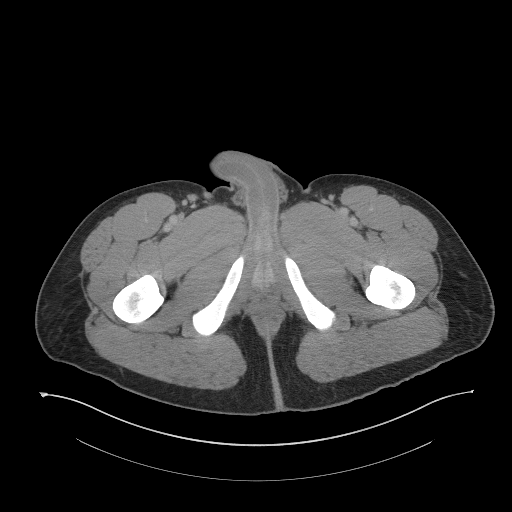
[im 24/119  soft-tissue]
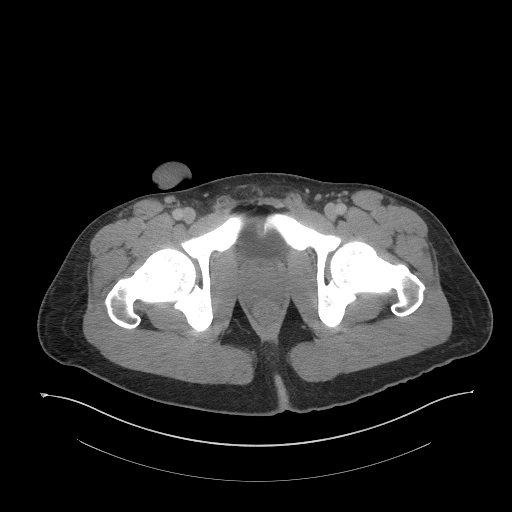
[im 34/119  soft-tissue]
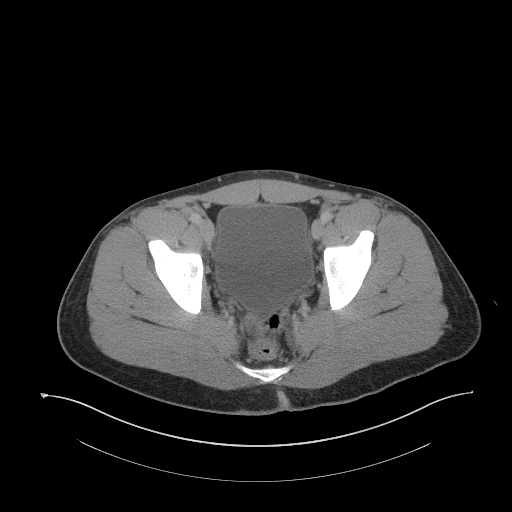
[im 43/119  soft-tissue]
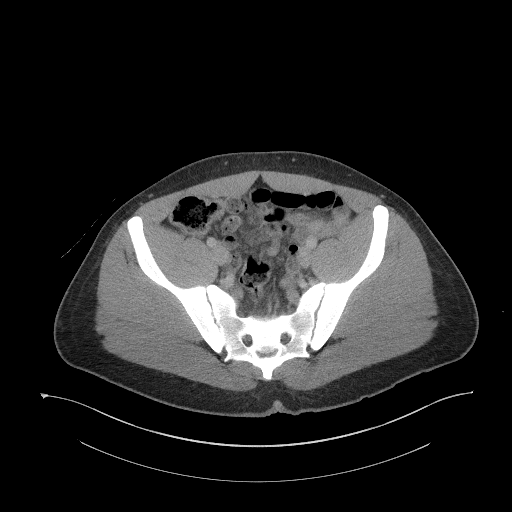
[im 52/119  soft-tissue]
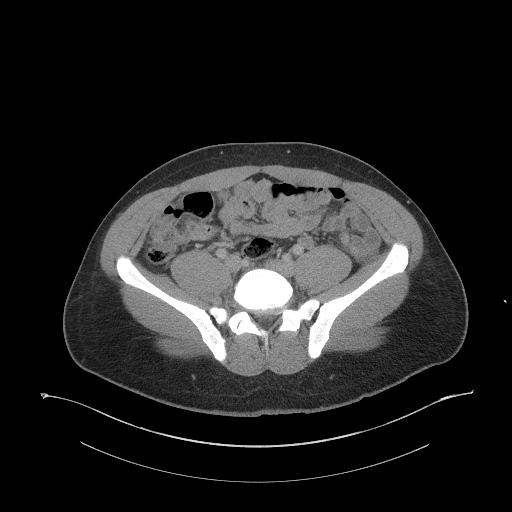
[im 62/119  soft-tissue]
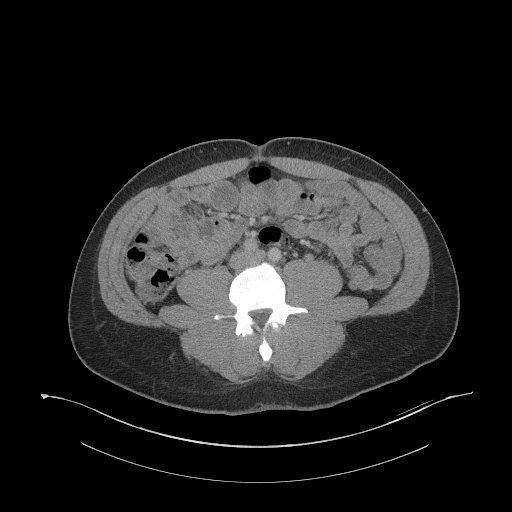
[im 67/119  soft-tissue]
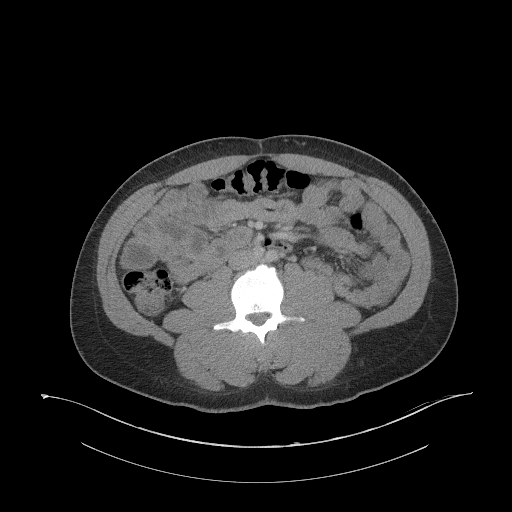
[im 76/119  soft-tissue]
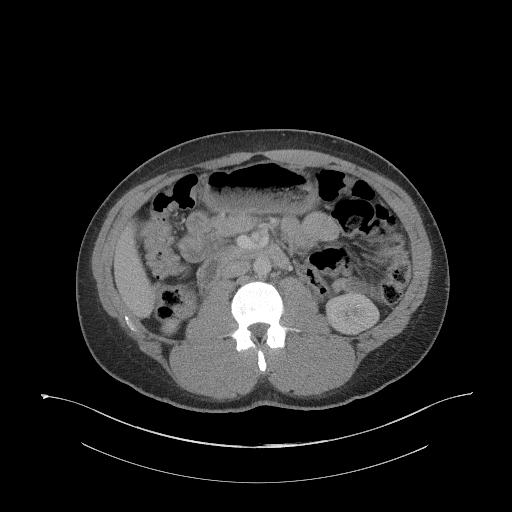
[im 76/119  bone]
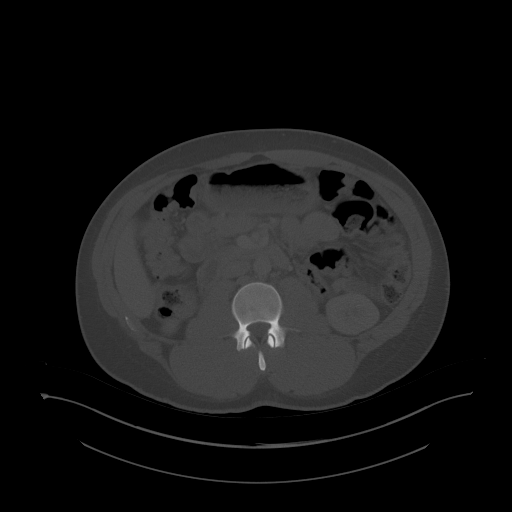
[im 85/119  soft-tissue]
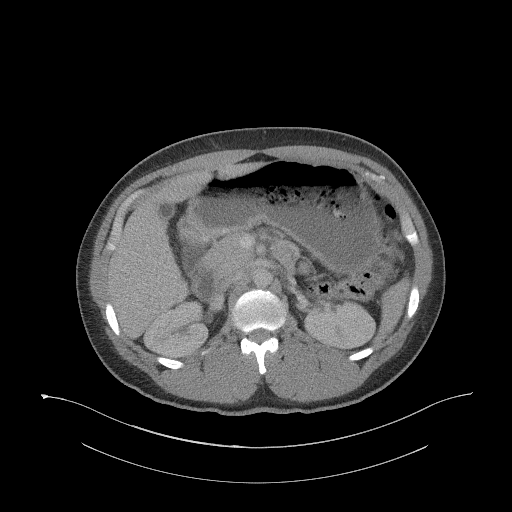
[im 95/119  soft-tissue]
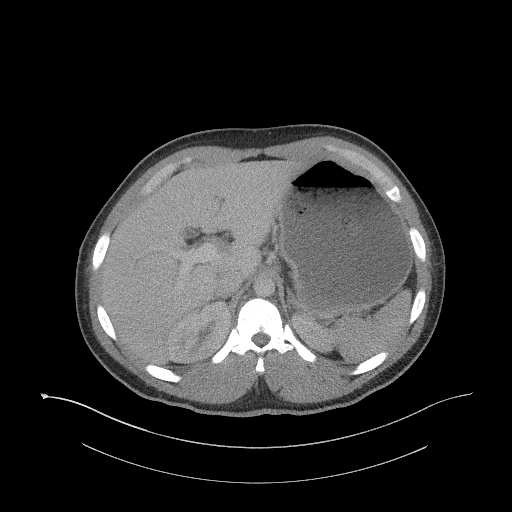
[im 104/119  soft-tissue]
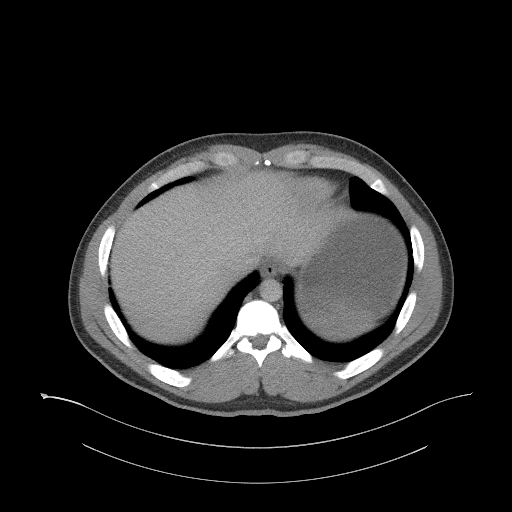
[im 114/119  soft-tissue]
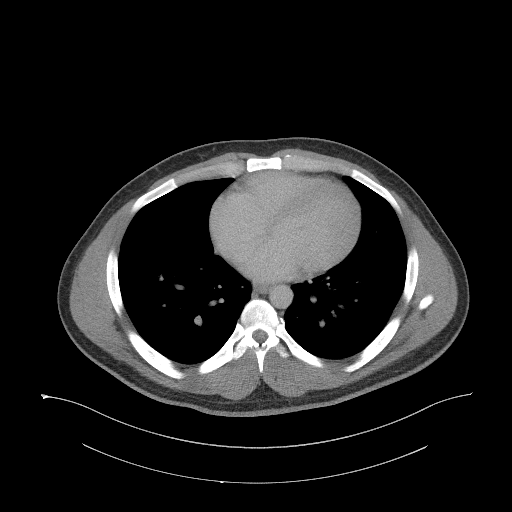

[Series 6: a/p w/ cor · coronal · 0.95mm/px · 3 of 166 slices shown]
[im 56/166  soft-tissue]
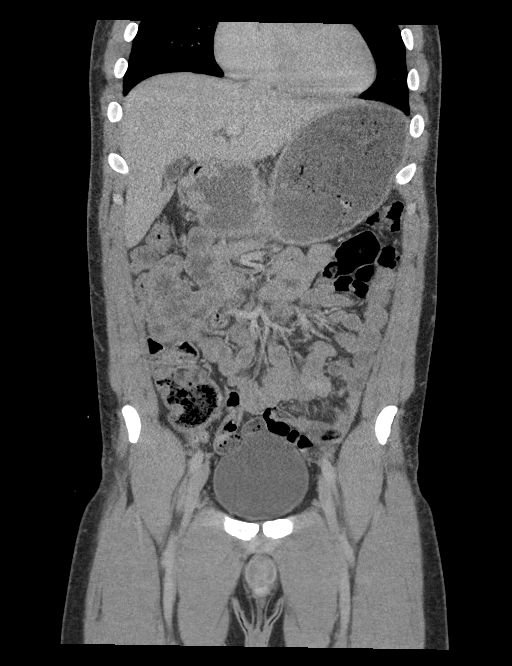
[im 74/166  soft-tissue]
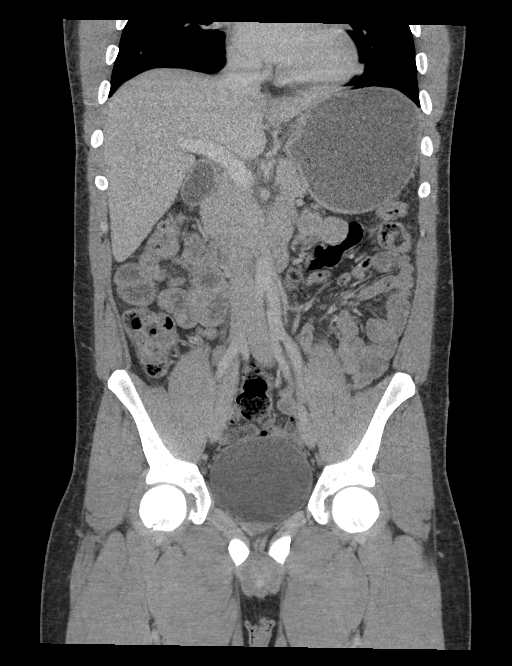
[im 92/166  soft-tissue]
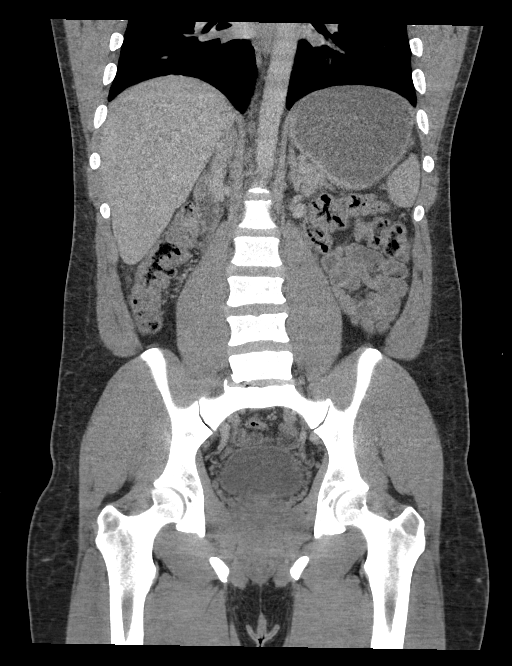

[16 of 46 positions shown; findings below may reference images not displayed]

FINDINGS: Lower chest: Lung bases are clear.

Hepatobiliary: No focal liver abnormality. Decompressed gallbladder.
No calcified gallstone. No pericholecystic fat stranding. There is
no biliary dilatation.

Pancreas: Unremarkable. No pancreatic ductal dilatation or
surrounding inflammatory changes.

Spleen: Normal in size without focal abnormality.

Adrenals/Urinary Tract: Normal adrenal glands. No hydronephrosis or
perinephric edema. Homogeneous renal enhancement. Urinary bladder is
physiologically distended without wall thickening.

Stomach/Bowel: The stomach is distended with fluid and ingested
contents. Normal positioning of the duodenum and ligament of Treitz.
Small bowel occasionally fluid-filled but nondilated. There is no
small bowel inflammation or obstruction. Fecalized distal small
bowel contents. No terminal ileal inflammation. Normal appendix
(series 3, image 83). Small to moderate volume of colonic stool. The
descending colon is tortuous. There is no colonic wall thickening or
pericolonic inflammation.

Vascular/Lymphatic: Normal caliber abdominal aorta. Patent portal
vein. No acute vascular findings. There prominent ileocolic lymph
nodes, all subcentimeter. There retroperitoneal or upper abdominal
adenopathy.

Reproductive: Prostate is unremarkable.

Other: No free air or free fluid. No focal fluid collection. Tiny
fat containing umbilical hernia.

Musculoskeletal: Endplate spurring at L3-L4. There are no acute or
suspicious osseous abnormalities.
IMPRESSION: 1. Prominent ileocolic lymph nodes, can be seen with mesenteric
adenitis.
2. Mild gastric distension with fluid and ingested material, but no
wall thickening. There is also fecalization of distal small bowel
contents. Overall findings suggest slow transit. There is no bowel
inflammation or obstruction.
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
## 2022-02-28 ENCOUNTER — Emergency Department (HOSPITAL_COMMUNITY)
Admission: EM | Admit: 2022-02-28 | Discharge: 2022-02-28 | Disposition: A | Discharge status: Home / Self Care | Payer: Self-pay | Attending: Emergency Medicine | Admitting: Emergency Medicine

## 2022-02-28 DIAGNOSIS — Z23 Encounter for immunization: Secondary | ICD-10-CM | POA: Insufficient documentation

## 2022-02-28 DIAGNOSIS — S81812A Laceration without foreign body, left lower leg, initial encounter: Secondary | ICD-10-CM

## 2022-02-28 DIAGNOSIS — S71112A Laceration without foreign body, left thigh, initial encounter: Secondary | ICD-10-CM | POA: Insufficient documentation

## 2022-02-28 MED ORDER — TETANUS-DIPHTH-ACELL PERTUSSIS 5-2.5-18.5 LF-MCG/0.5 IM SUSY
0.5000 mL | PREFILLED_SYRINGE | Freq: Once | INTRAMUSCULAR | Status: AC
  Administered 2022-02-28: 0.5 mL via INTRAMUSCULAR
  Filled 2022-02-28: qty 0.5

## 2022-02-28 MED ORDER — LIDOCAINE HCL (PF) 1 % IJ SOLN
30.0000 mL | Freq: Once | INTRAMUSCULAR | Status: DC
  Filled 2022-02-28: qty 30

## 2022-02-28 MED ORDER — IBUPROFEN 800 MG PO TABS
800.0000 mg | ORAL_TABLET | Freq: Once | ORAL | Status: AC
  Administered 2022-02-28: 800 mg via ORAL
  Filled 2022-02-28: qty 1

## 2022-02-28 MED ORDER — CEPHALEXIN 500 MG PO CAPS
500.0000 mg | ORAL_CAPSULE | Freq: Four times a day (QID) | ORAL | 0 refills | Status: AC
Start: 2022-02-28 — End: ?
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
## 2022-02-28 NOTE — Discharge Instructions (Signed)
Take the prescribed medication as directed. Keep wound clean with soap and warm water. Return to the ED for new or worsening symptoms. 
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
## 2022-03-11 ENCOUNTER — Other Ambulatory Visit: Payer: Self-pay

## 2022-03-11 ENCOUNTER — Emergency Department (HOSPITAL_COMMUNITY)
Admission: EM | Admit: 2022-03-11 | Discharge: 2022-03-12 | Disposition: A | Discharge status: Home / Self Care | Payer: Self-pay | Attending: Emergency Medicine | Admitting: Emergency Medicine

## 2022-03-11 ENCOUNTER — Emergency Department (HOSPITAL_COMMUNITY): Payer: Self-pay

## 2022-03-11 ENCOUNTER — Encounter (HOSPITAL_COMMUNITY): Payer: Self-pay

## 2022-03-11 DIAGNOSIS — W228XXD Striking against or struck by other objects, subsequent encounter: Secondary | ICD-10-CM | POA: Insufficient documentation

## 2022-03-11 DIAGNOSIS — S81812D Laceration without foreign body, left lower leg, subsequent encounter: Secondary | ICD-10-CM

## 2022-03-11 DIAGNOSIS — S71112D Laceration without foreign body, left thigh, subsequent encounter: Secondary | ICD-10-CM | POA: Insufficient documentation
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
## 2022-03-12 MED ORDER — IBUPROFEN 400 MG PO TABS
600.0000 mg | ORAL_TABLET | Freq: Once | ORAL | Status: AC
  Administered 2022-03-12: 600 mg via ORAL
  Filled 2022-03-12: qty 1

## 2022-03-12 NOTE — Discharge Instructions (Signed)
Take ibuprofen as needed at home for pain.  Apply antibiotic ointment and keep dressed.  Monitor for signs and symptoms of infection. ?
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
## 2022-04-12 ENCOUNTER — Emergency Department (HOSPITAL_COMMUNITY)
Admission: EM | Admit: 2022-04-12 | Discharge: 2022-04-12 | Disposition: A | Discharge status: Home / Self Care | Payer: Self-pay | Source: Home / Self Care | Attending: Emergency Medicine | Admitting: Emergency Medicine

## 2022-04-12 ENCOUNTER — Encounter (HOSPITAL_COMMUNITY): Payer: Self-pay | Admitting: Emergency Medicine

## 2022-04-12 ENCOUNTER — Other Ambulatory Visit: Payer: Self-pay

## 2022-04-12 ENCOUNTER — Emergency Department (HOSPITAL_COMMUNITY)
Admission: EM | Admit: 2022-04-12 | Discharge: 2022-04-12 | Disposition: A | Discharge status: Home / Self Care | Payer: Self-pay | Attending: Emergency Medicine | Admitting: Emergency Medicine

## 2022-04-12 DIAGNOSIS — F10120 Alcohol abuse with intoxication, uncomplicated: Secondary | ICD-10-CM | POA: Insufficient documentation

## 2022-04-12 DIAGNOSIS — F19920 Other psychoactive substance use, unspecified with intoxication, uncomplicated: Secondary | ICD-10-CM

## 2022-04-12 DIAGNOSIS — Z5900 Homelessness unspecified: Secondary | ICD-10-CM | POA: Insufficient documentation

## 2022-04-12 DIAGNOSIS — H119 Unspecified disorder of conjunctiva: Secondary | ICD-10-CM | POA: Insufficient documentation

## 2022-04-12 DIAGNOSIS — R4182 Altered mental status, unspecified: Secondary | ICD-10-CM | POA: Insufficient documentation

## 2022-04-12 DIAGNOSIS — R7989 Other specified abnormal findings of blood chemistry: Secondary | ICD-10-CM | POA: Insufficient documentation

## 2022-04-12 DIAGNOSIS — E781 Pure hyperglyceridemia: Secondary | ICD-10-CM | POA: Insufficient documentation

## 2022-04-12 LAB — CBC WITH DIFFERENTIAL/PLATELET
Abs Immature Granulocytes: 0.02 10*3/uL (ref 0.00–0.07)
Basophils Absolute: 0.1 10*3/uL (ref 0.0–0.1)
Basophils Relative: 1 %
Eosinophils Absolute: 0.5 10*3/uL (ref 0.0–0.5)
Eosinophils Relative: 5 %
HCT: 46.9 % (ref 39.0–52.0)
Hemoglobin: 15.3 g/dL (ref 13.0–17.0)
Immature Granulocytes: 0 %
Lymphocytes Relative: 12 %
Lymphs Abs: 1 10*3/uL (ref 0.7–4.0)
MCH: 32.2 pg (ref 26.0–34.0)
MCHC: 32.6 g/dL (ref 30.0–36.0)
MCV: 98.7 fL (ref 80.0–100.0)
Monocytes Absolute: 0.5 10*3/uL (ref 0.1–1.0)
Monocytes Relative: 5 %
Neutro Abs: 6.9 10*3/uL (ref 1.7–7.7)
Neutrophils Relative %: 77 %
Platelets: 296 10*3/uL (ref 150–400)
RBC: 4.75 MIL/uL (ref 4.22–5.81)
RDW: 12.9 % (ref 11.5–15.5)
WBC: 8.9 10*3/uL (ref 4.0–10.5)
nRBC: 0 % (ref 0.0–0.2)

## 2022-04-12 LAB — COMPREHENSIVE METABOLIC PANEL
ALT: 95 U/L — ABNORMAL HIGH (ref 0–44)
AST: 100 U/L — ABNORMAL HIGH (ref 15–41)
Albumin: 4.2 g/dL (ref 3.5–5.0)
Alkaline Phosphatase: 84 U/L (ref 38–126)
Anion gap: 4 — ABNORMAL LOW (ref 5–15)
BUN: 9 mg/dL (ref 6–20)
CO2: 29 mmol/L (ref 22–32)
Calcium: 9.2 mg/dL (ref 8.9–10.3)
Chloride: 105 mmol/L (ref 98–111)
Creatinine, Ser: 1.14 mg/dL (ref 0.61–1.24)
GFR, Estimated: >60 mL/min (ref >60)
Glucose, Bld: 118 mg/dL — ABNORMAL HIGH (ref 70–99)
Potassium: 3.7 mmol/L (ref 3.5–5.1)
Sodium: 138 mmol/L (ref 135–145)
Total Bilirubin: 0.5 mg/dL (ref 0.3–1.2)
Total Protein: 8.3 g/dL — ABNORMAL HIGH (ref 6.5–8.1)

## 2022-04-12 LAB — ETHANOL: Alcohol, Ethyl (B): <10 mg/dL (ref <10)
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
## 2022-04-12 NOTE — ED Notes (Signed)
Pt's mother called for update.  Assured pt is stable and she will call back later for further.  ?
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
## 2022-04-14 ENCOUNTER — Encounter (HOSPITAL_COMMUNITY): Payer: Self-pay

## 2024-02-06 ENCOUNTER — Emergency Department (HOSPITAL_COMMUNITY): Payer: Self-pay

## 2024-02-06 ENCOUNTER — Encounter (HOSPITAL_COMMUNITY): Payer: Self-pay

## 2024-02-06 ENCOUNTER — Other Ambulatory Visit: Payer: Self-pay

## 2024-02-06 ENCOUNTER — Inpatient Hospital Stay (HOSPITAL_COMMUNITY)
Admission: EM | Admit: 2024-02-06 | Discharge: 2024-02-07 | DRG: 557 | Discharge status: Against Medical Advice | Payer: Self-pay | Attending: Internal Medicine | Admitting: Internal Medicine

## 2024-02-06 DIAGNOSIS — S81831A Puncture wound without foreign body, right lower leg, initial encounter: Secondary | ICD-10-CM

## 2024-02-06 DIAGNOSIS — G9341 Metabolic encephalopathy: Secondary | ICD-10-CM | POA: Insufficient documentation

## 2024-02-06 DIAGNOSIS — R451 Restlessness and agitation: Secondary | ICD-10-CM

## 2024-02-06 DIAGNOSIS — M6282 Rhabdomyolysis: Secondary | ICD-10-CM

## 2024-02-06 DIAGNOSIS — W3400XA Accidental discharge from unspecified firearms or gun, initial encounter: Principal | ICD-10-CM

## 2024-02-06 DIAGNOSIS — F1490 Cocaine use, unspecified, uncomplicated: Secondary | ICD-10-CM

## 2024-02-06 DIAGNOSIS — Z8614 Personal history of Methicillin resistant Staphylococcus aureus infection: Secondary | ICD-10-CM

## 2024-02-06 DIAGNOSIS — Z91013 Allergy to seafood: Secondary | ICD-10-CM

## 2024-02-06 DIAGNOSIS — S71131A Puncture wound without foreign body, right thigh, initial encounter: Secondary | ICD-10-CM | POA: Diagnosis present

## 2024-02-06 DIAGNOSIS — D72828 Other elevated white blood cell count: Secondary | ICD-10-CM | POA: Diagnosis present

## 2024-02-06 DIAGNOSIS — F17210 Nicotine dependence, cigarettes, uncomplicated: Secondary | ICD-10-CM | POA: Diagnosis present

## 2024-02-06 DIAGNOSIS — G928 Other toxic encephalopathy: Secondary | ICD-10-CM | POA: Diagnosis present

## 2024-02-06 DIAGNOSIS — F1413 Cocaine abuse, unspecified with withdrawal: Secondary | ICD-10-CM | POA: Diagnosis present

## 2024-02-06 HISTORY — DX: Cocaine abuse, uncomplicated: F14.10

## 2024-02-06 HISTORY — DX: Homelessness unspecified: Z59.00

## 2024-02-06 HISTORY — DX: Carrier or suspected carrier of methicillin resistant Staphylococcus aureus: Z22.322

## 2024-02-06 HISTORY — DX: Migraine, unspecified, not intractable, without status migrainosus: G43.909

## 2024-02-06 LAB — COMPREHENSIVE METABOLIC PANEL
ALT: 34 U/L (ref 0–44)
AST: 145 U/L — ABNORMAL HIGH (ref 15–41)
Albumin: 3.7 g/dL (ref 3.5–5.0)
Alkaline Phosphatase: 45 U/L (ref 38–126)
Anion gap: 11 (ref 5–15)
BUN: 12 mg/dL (ref 6–20)
CO2: 29 mmol/L (ref 22–32)
Calcium: 9.3 mg/dL (ref 8.9–10.3)
Chloride: 98 mmol/L (ref 98–111)
Creatinine, Ser: 1.2 mg/dL (ref 0.61–1.24)
GFR, Estimated: >60 mL/min (ref >60)
Glucose, Bld: 87 mg/dL (ref 70–99)
Potassium: 4.4 mmol/L (ref 3.5–5.1)
Sodium: 138 mmol/L (ref 135–145)
Total Bilirubin: 0.5 mg/dL (ref 0.0–1.2)
Total Protein: 7.5 g/dL (ref 6.5–8.1)

## 2024-02-06 LAB — I-STAT CHEM 8, ED
BUN: 13 mg/dL (ref 6–20)
Calcium, Ion: 1.05 mmol/L — ABNORMAL LOW (ref 1.15–1.40)
Chloride: 100 mmol/L (ref 98–111)
Creatinine, Ser: 1.3 mg/dL — ABNORMAL HIGH (ref 0.61–1.24)
Glucose, Bld: 84 mg/dL (ref 70–99)
HCT: 40 % (ref 39.0–52.0)
Hemoglobin: 13.6 g/dL (ref 13.0–17.0)
Potassium: 4.4 mmol/L (ref 3.5–5.1)
Sodium: 138 mmol/L (ref 135–145)
TCO2: 29 mmol/L (ref 22–32)

## 2024-02-06 LAB — CBC
HCT: 39.8 % (ref 39.0–52.0)
Hemoglobin: 13.2 g/dL (ref 13.0–17.0)
MCH: 31.7 pg (ref 26.0–34.0)
MCHC: 33.2 g/dL (ref 30.0–36.0)
MCV: 95.4 fL (ref 80.0–100.0)
Platelets: 328 10*3/uL (ref 150–400)
RBC: 4.17 MIL/uL — ABNORMAL LOW (ref 4.22–5.81)
RDW: 13.6 % (ref 11.5–15.5)
WBC: 11.8 10*3/uL — ABNORMAL HIGH (ref 4.0–10.5)
nRBC: 0 % (ref 0.0–0.2)

## 2024-02-06 LAB — ETHANOL: Alcohol, Ethyl (B): <10 mg/dL (ref <10)

## 2024-02-06 LAB — I-STAT CG4 LACTIC ACID, ED: Lactic Acid, Venous: 1.4 mmol/L (ref 0.5–1.9)

## 2024-02-06 LAB — PROTIME-INR
INR: 1 (ref 0.8–1.2)
Prothrombin Time: 13.8 s (ref 11.4–15.2)

## 2024-02-06 MED ORDER — ONDANSETRON HCL 4 MG/2ML IJ SOLN
INTRAMUSCULAR | Status: AC
  Administered 2024-02-06: 4 mg via INTRAVENOUS
  Filled 2024-02-06: qty 2

## 2024-02-06 MED ORDER — CEFAZOLIN SODIUM-DEXTROSE 2-4 GM/100ML-% IV SOLN
2.0000 g | Freq: Once | INTRAVENOUS | Status: AC
  Administered 2024-02-06: 2 g via INTRAVENOUS
  Filled 2024-02-06: qty 100

## 2024-02-06 MED ORDER — LORAZEPAM 2 MG/ML IJ SOLN: 1.0000 mg | Freq: Once | INTRAMUSCULAR | Status: DC

## 2024-02-06 MED ORDER — LORAZEPAM 2 MG/ML IJ SOLN: 2.0000 mg | Freq: Once | INTRAMUSCULAR | Status: DC

## 2024-02-06 MED ORDER — LORAZEPAM 2 MG/ML IJ SOLN
INTRAMUSCULAR | Status: AC
  Administered 2024-02-06: 1 mg via INTRAVENOUS
  Filled 2024-02-06: qty 1

## 2024-02-06 MED ORDER — ACETAMINOPHEN 325 MG PO TABS: 650.0000 mg | ORAL_TABLET | Freq: Once | ORAL | Status: DC

## 2024-02-06 MED ORDER — LORAZEPAM 2 MG/ML IJ SOLN: 1.0000 mg | Freq: Once | INTRAMUSCULAR | Status: AC

## 2024-02-06 MED ORDER — IOHEXOL 350 MG/ML SOLN
150.0000 mL | Freq: Once | INTRAVENOUS | Status: AC | PRN
  Administered 2024-02-06: 150 mL via INTRAVENOUS

## 2024-02-06 MED ORDER — ONDANSETRON HCL 4 MG/2ML IJ SOLN: 4.0000 mg | Freq: Once | INTRAMUSCULAR | Status: AC

## 2024-02-06 MED ORDER — HYDROMORPHONE HCL 1 MG/ML IJ SOLN: 1.0000 mg | Freq: Once | INTRAMUSCULAR | Status: AC

## 2024-02-06 MED ORDER — BACITRACIN ZINC 500 UNIT/GM EX OINT
TOPICAL_OINTMENT | Freq: Two times a day (BID) | CUTANEOUS | Status: DC
  Administered 2024-02-06: 1 via TOPICAL
  Filled 2024-02-06: qty 0.9

## 2024-02-06 MED ORDER — TETANUS-DIPHTH-ACELL PERTUSSIS 5-2.5-18.5 LF-MCG/0.5 IM SUSY: 0.5000 mL | PREFILLED_SYRINGE | Freq: Once | INTRAMUSCULAR | Status: DC

## 2024-02-06 MED ORDER — HYDROMORPHONE HCL 1 MG/ML IJ SOLN
INTRAMUSCULAR | Status: AC
  Administered 2024-02-06: 1 mg via INTRAVENOUS
  Filled 2024-02-06: qty 1

## 2024-02-06 MED ORDER — LACTATED RINGERS IV BOLUS
1000.0000 mL | Freq: Once | INTRAVENOUS | Status: AC
  Administered 2024-02-06: 1000 mL via INTRAVENOUS
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
## 2024-02-06 NOTE — ED Notes (Signed)
 Pt placed on [facility] after administration of dilaudid and ativan.

## 2024-02-06 NOTE — ED Notes (Addendum)
 2 years ago (2023) per pt of last tetanus, Dr. Silverio Lay aware

## 2024-02-06 NOTE — ED Notes (Signed)
 Pt to CT scanner at this time with trauma RN Autumn, pt remains on cardiac monitoring, bleeding controlled.

## 2024-02-06 NOTE — ED Notes (Signed)
 Pt return from CT scanner with this RN and Autumn

## 2024-02-06 NOTE — Progress Notes (Signed)
 Orthopedic Tech Progress Note Patient Details:  Donald Hurst December 10, 1991 [id number]  Patient ID: Donald Hurst, male   DOB: 02/10/1991, 33 y.o.   MRN: [id number] I attended trauma page. Trinna Post 02/06/2024, 11:03 PM
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
## 2024-02-07 ENCOUNTER — Encounter (HOSPITAL_COMMUNITY): Payer: Self-pay | Admitting: Internal Medicine

## 2024-02-07 DIAGNOSIS — W3400XA Accidental discharge from unspecified firearms or gun, initial encounter: Principal | ICD-10-CM

## 2024-02-07 DIAGNOSIS — M6282 Rhabdomyolysis: Secondary | ICD-10-CM

## 2024-02-07 DIAGNOSIS — G9341 Metabolic encephalopathy: Secondary | ICD-10-CM | POA: Insufficient documentation

## 2024-02-07 DIAGNOSIS — F1490 Cocaine use, unspecified, uncomplicated: Secondary | ICD-10-CM

## 2024-02-07 DIAGNOSIS — R451 Restlessness and agitation: Secondary | ICD-10-CM

## 2024-02-07 DIAGNOSIS — S81831A Puncture wound without foreign body, right lower leg, initial encounter: Secondary | ICD-10-CM

## 2024-02-07 DIAGNOSIS — Y249XXA Unspecified firearm discharge, undetermined intent, initial encounter: Secondary | ICD-10-CM

## 2024-02-07 LAB — URINALYSIS, ROUTINE W REFLEX MICROSCOPIC
Bacteria, UA: NONE SEEN
Bilirubin Urine: NEGATIVE
Glucose, UA: NEGATIVE mg/dL
Hgb urine dipstick: NEGATIVE
Ketones, ur: NEGATIVE mg/dL
Nitrite: NEGATIVE
Protein, ur: NEGATIVE mg/dL
Specific Gravity, Urine: 1.045 — ABNORMAL HIGH (ref 1.005–1.030)
pH: 6 (ref 5.0–8.0)

## 2024-02-07 LAB — RAPID URINE DRUG SCREEN, HOSP PERFORMED
Amphetamines: NOT DETECTED
Barbiturates: NOT DETECTED
Benzodiazepines: NOT DETECTED
Cocaine: POSITIVE — AB
Opiates: NOT DETECTED
Tetrahydrocannabinol: POSITIVE — AB

## 2024-02-07 LAB — COMPREHENSIVE METABOLIC PANEL
ALT: 28 U/L (ref 0–44)
AST: 109 U/L — ABNORMAL HIGH (ref 15–41)
Albumin: 3 g/dL — ABNORMAL LOW (ref 3.5–5.0)
Alkaline Phosphatase: 41 U/L (ref 38–126)
Anion gap: 7 (ref 5–15)
BUN: 11 mg/dL (ref 6–20)
CO2: 26 mmol/L (ref 22–32)
Calcium: 8.6 mg/dL — ABNORMAL LOW (ref 8.9–10.3)
Chloride: 103 mmol/L (ref 98–111)
Creatinine, Ser: 1 mg/dL (ref 0.61–1.24)
GFR, Estimated: >60 mL/min (ref >60)
Glucose, Bld: 91 mg/dL (ref 70–99)
Potassium: 4 mmol/L (ref 3.5–5.1)
Sodium: 136 mmol/L (ref 135–145)
Total Bilirubin: 0.6 mg/dL (ref 0.0–1.2)
Total Protein: 6.5 g/dL (ref 6.5–8.1)

## 2024-02-07 LAB — CK
Total CK: 7805 U/L — ABNORMAL HIGH (ref 49–397)
Total CK: 5085 U/L — ABNORMAL HIGH (ref 49–397)

## 2024-02-07 LAB — CBC
HCT: 37.1 % — ABNORMAL LOW (ref 39.0–52.0)
Hemoglobin: 12.6 g/dL — ABNORMAL LOW (ref 13.0–17.0)
MCH: 31.9 pg (ref 26.0–34.0)
MCHC: 34 g/dL (ref 30.0–36.0)
MCV: 93.9 fL (ref 80.0–100.0)
Platelets: 304 10*3/uL (ref 150–400)
RBC: 3.95 MIL/uL — ABNORMAL LOW (ref 4.22–5.81)
RDW: 13.7 % (ref 11.5–15.5)
WBC: 10.3 10*3/uL (ref 4.0–10.5)
nRBC: 0 % (ref 0.0–0.2)

## 2024-02-07 MED ORDER — ACETAMINOPHEN 650 MG RE SUPP: 650.0000 mg | Freq: Four times a day (QID) | RECTAL | Status: DC | PRN

## 2024-02-07 MED ORDER — SODIUM CHLORIDE 0.9% FLUSH: 3.0000 mL | Freq: Two times a day (BID) | INTRAVENOUS | Status: DC

## 2024-02-07 MED ORDER — ONDANSETRON HCL 4 MG/2ML IJ SOLN
4.0000 mg | Freq: Four times a day (QID) | INTRAMUSCULAR | Status: DC | PRN
  Administered 2024-02-07: 4 mg via INTRAVENOUS
  Filled 2024-02-07: qty 2

## 2024-02-07 MED ORDER — HYDROMORPHONE HCL 1 MG/ML IJ SOLN
0.5000 mg | INTRAMUSCULAR | Status: DC | PRN
  Administered 2024-02-07 (×2): 1 mg via INTRAVENOUS
  Filled 2024-02-07 (×2): qty 1

## 2024-02-07 MED ORDER — LORAZEPAM 2 MG/ML IJ SOLN: 2.0000 mg | Freq: Four times a day (QID) | INTRAMUSCULAR | Status: DC | PRN

## 2024-02-07 MED ORDER — LACTATED RINGERS IV BOLUS: 1000.0000 mL | Freq: Once | INTRAVENOUS | Status: DC

## 2024-02-07 MED ORDER — LORAZEPAM 2 MG/ML IJ SOLN
1.0000 mg | Freq: Once | INTRAMUSCULAR | Status: AC
  Administered 2024-02-07: 1 mg via INTRAVENOUS
  Filled 2024-02-07: qty 1

## 2024-02-07 MED ORDER — SODIUM CHLORIDE 0.9% FLUSH: 3.0000 mL | INTRAVENOUS | Status: DC | PRN

## 2024-02-07 MED ORDER — ONDANSETRON HCL 4 MG PO TABS: 4.0000 mg | ORAL_TABLET | Freq: Four times a day (QID) | ORAL | Status: DC | PRN

## 2024-02-07 MED ORDER — ACETAMINOPHEN 325 MG PO TABS: 650.0000 mg | ORAL_TABLET | Freq: Four times a day (QID) | ORAL | Status: DC | PRN

## 2024-02-07 MED ORDER — LACTATED RINGERS IV BOLUS
1000.0000 mL | Freq: Once | INTRAVENOUS | Status: AC
  Administered 2024-02-07: 1000 mL via INTRAVENOUS

## 2024-02-07 MED ORDER — VANCOMYCIN HCL 2000 MG/400ML IV SOLN
2000.0000 mg | Freq: Two times a day (BID) | INTRAVENOUS | Status: DC
  Administered 2024-02-07 (×2): 2000 mg via INTRAVENOUS
  Filled 2024-02-07 (×3): qty 400

## 2024-02-07 MED ORDER — LORAZEPAM 2 MG/ML IJ SOLN: 1.0000 mg | Freq: Four times a day (QID) | INTRAMUSCULAR | Status: DC | PRN

## 2024-02-07 MED ORDER — VANCOMYCIN HCL 2000 MG/400ML IV SOLN
2000.0000 mg | Freq: Once | INTRAVENOUS | Status: DC
  Filled 2024-02-07: qty 400

## 2024-02-07 MED ORDER — SODIUM CHLORIDE 0.9 % IV SOLN: 250.0000 mL | INTRAVENOUS | Status: DC | PRN

## 2024-02-07 MED ORDER — LACTATED RINGERS IV SOLN: INTRAVENOUS | Status: DC

## 2024-02-07 MED ORDER — SODIUM CHLORIDE 0.9 % IV SOLN
2.0000 g | Freq: Three times a day (TID) | INTRAVENOUS | Status: DC
  Administered 2024-02-07 (×2): 2 g via INTRAVENOUS
  Filled 2024-02-07 (×2): qty 12.5

## 2024-02-07 NOTE — ED Notes (Signed)
 Pt has removed nasal cannula and capnography tubing, also has disconnected some of his EKG leads d/t movement in bed, this RN reconnected cardiac leads. Pt appears to be comfortable and resting, can observe even RR, snoring noted and pt is mouth breathing, pt remains on cardiac monitoring devices, no changes noted, side rails up x2 for safety, NAD noted, plan of care ongoing, call light within reach, staff can monitor pt from nurses station, no further concerns as of present.
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
## 2024-02-07 NOTE — Progress Notes (Signed)
 Pt got dressed and limped out of the doorway, with IV in his arm Ben,RN tried to stop pt at th elevator. Pt stated that he was going to the lobby and was not listening to Ben,RN. Pt got on the elevator. Pt on IVC or under arrest. Pieter Partridge, RN called Ival Bible and notified her, secretary called security as well and pt MD notified as well of the situation. Security located pt in the ED and ED RN's removed his IV and pt will be discharged AMA from the ED.
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
## 2024-02-07 NOTE — TOC CAGE-AID Note (Signed)
 Transition of Care [facility]) - CAGE-AID Screening   Patient Details  Name: Donald Hurst MRN: [id number] Date of Birth: 11/27/1991    Judie Bonus, RN Phone Number: 02/07/2024, 6:19 AM   Clinical Narrative: Pt reports +tobacco/+cocaine/no etoh but is unable to answer other questions. Numerous previous visits for drug induced AMS, COWS ordered by admitting provider.    CAGE-AID Screening: Substance Abuse Screening unable to be completed due to: : Patient unable to participate (Intermittent AMS/drug induced intoxication)
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
## 2024-02-07 NOTE — ED Notes (Signed)
 Pt up out of bed walking in room naked, pt disconnected self from monitor, pulse and bp cuff. Pt in bed

## 2024-02-07 NOTE — ED Notes (Signed)
 Floor called informed pt being transported.

## 2024-02-08 ENCOUNTER — Encounter (HOSPITAL_COMMUNITY): Payer: Self-pay

## 2024-02-12 LAB — CULTURE, BLOOD (ROUTINE X 2)
Culture: NO GROWTH
Culture: NO GROWTH
Special Requests: ADEQUATE
Special Requests: ADEQUATE
